# Patient Record
Sex: Female | Born: 1945 | Race: White | Hispanic: No | Smoking: Former smoker
Health system: Southern US, Community
[De-identification: ages and names within clinical notes are randomized; demographics above are authoritative.]

## PROBLEM LIST (undated history)

## (undated) DIAGNOSIS — D649 Anemia, unspecified: Secondary | ICD-10-CM

## (undated) DIAGNOSIS — R319 Hematuria, unspecified: Secondary | ICD-10-CM

## (undated) DIAGNOSIS — E039 Hypothyroidism, unspecified: Secondary | ICD-10-CM

## (undated) DIAGNOSIS — Z79899 Other long term (current) drug therapy: Secondary | ICD-10-CM

## (undated) DIAGNOSIS — I369 Nonrheumatic tricuspid valve disorder, unspecified: Secondary | ICD-10-CM

## (undated) DIAGNOSIS — G9081 Serotonin syndrome: Secondary | ICD-10-CM

## (undated) DIAGNOSIS — R413 Other amnesia: Secondary | ICD-10-CM

## (undated) DIAGNOSIS — F322 Major depressive disorder, single episode, severe without psychotic features: Secondary | ICD-10-CM

## (undated) DIAGNOSIS — K6289 Other specified diseases of anus and rectum: Secondary | ICD-10-CM

## (undated) DIAGNOSIS — N8111 Cystocele, midline: Secondary | ICD-10-CM

## (undated) DIAGNOSIS — M479 Spondylosis, unspecified: Secondary | ICD-10-CM

## (undated) DIAGNOSIS — N184 Chronic kidney disease, stage 4 (severe): Secondary | ICD-10-CM

## (undated) DIAGNOSIS — A0472 Enterocolitis due to Clostridium difficile, not specified as recurrent: Secondary | ICD-10-CM

## (undated) DIAGNOSIS — N390 Urinary tract infection, site not specified: Secondary | ICD-10-CM

## (undated) DIAGNOSIS — M170 Bilateral primary osteoarthritis of knee: Secondary | ICD-10-CM

## (undated) DIAGNOSIS — R296 Repeated falls: Secondary | ICD-10-CM

## (undated) DIAGNOSIS — R339 Retention of urine, unspecified: Secondary | ICD-10-CM

## (undated) DIAGNOSIS — N200 Calculus of kidney: Secondary | ICD-10-CM

## (undated) DIAGNOSIS — Z9884 Bariatric surgery status: Secondary | ICD-10-CM

## (undated) DIAGNOSIS — N819 Female genital prolapse, unspecified: Secondary | ICD-10-CM

## (undated) DIAGNOSIS — I150 Renovascular hypertension: Secondary | ICD-10-CM

## (undated) DIAGNOSIS — I5030 Unspecified diastolic (congestive) heart failure: Secondary | ICD-10-CM

## (undated) DIAGNOSIS — I10 Essential (primary) hypertension: Secondary | ICD-10-CM

## (undated) DIAGNOSIS — N816 Rectocele: Secondary | ICD-10-CM

## (undated) DIAGNOSIS — C541 Malignant neoplasm of endometrium: Secondary | ICD-10-CM

## (undated) DIAGNOSIS — N3941 Urge incontinence: Secondary | ICD-10-CM

## (undated) DIAGNOSIS — H9190 Unspecified hearing loss, unspecified ear: Secondary | ICD-10-CM

## (undated) DIAGNOSIS — I639 Cerebral infarction, unspecified: Secondary | ICD-10-CM

## (undated) DIAGNOSIS — I700 Atherosclerosis of aorta: Secondary | ICD-10-CM

## (undated) DIAGNOSIS — R0600 Dyspnea, unspecified: Secondary | ICD-10-CM

## (undated) DIAGNOSIS — I471 Supraventricular tachycardia, unspecified: Secondary | ICD-10-CM

## (undated) DIAGNOSIS — G4700 Insomnia, unspecified: Secondary | ICD-10-CM

## (undated) DIAGNOSIS — I951 Orthostatic hypotension: Secondary | ICD-10-CM

## (undated) DIAGNOSIS — F1120 Opioid dependence, uncomplicated: Secondary | ICD-10-CM

## (undated) DIAGNOSIS — R569 Unspecified convulsions: Secondary | ICD-10-CM

## (undated) DIAGNOSIS — G4730 Sleep apnea, unspecified: Secondary | ICD-10-CM

## (undated) DIAGNOSIS — G9340 Encephalopathy, unspecified: Secondary | ICD-10-CM

## (undated) DIAGNOSIS — B9620 Unspecified Escherichia coli [E. coli] as the cause of diseases classified elsewhere: Secondary | ICD-10-CM

## (undated) DIAGNOSIS — G2579 Other drug induced movement disorders: Secondary | ICD-10-CM

## (undated) DIAGNOSIS — R000 Tachycardia, unspecified: Secondary | ICD-10-CM

## (undated) DIAGNOSIS — M47815 Spondylosis without myelopathy or radiculopathy, thoracolumbar region: Secondary | ICD-10-CM

## (undated) HISTORY — DX: Other specified diseases of anus and rectum: K62.89

## (undated) HISTORY — DX: Nonrheumatic tricuspid valve disorder, unspecified: I36.9

## (undated) HISTORY — DX: Insomnia, unspecified: G47.00

## (undated) HISTORY — PX: ANTERIOR AND POSTERIOR VAGINAL REPAIR: SUR5

## (undated) HISTORY — DX: Major depressive disorder, single episode, severe without psychotic features: F32.2

## (undated) HISTORY — DX: Hypocalcemia: E83.51

## (undated) HISTORY — DX: Unspecified hearing loss, unspecified ear: H91.90

## (undated) HISTORY — DX: Calculus of kidney: N20.0

## (undated) HISTORY — PX: HIP ARTHROPLASTY: SHX981

## (undated) HISTORY — PX: EXPLORATORY LAPAROTOMY: SUR591

## (undated) HISTORY — DX: Cystocele, midline: N81.11

## (undated) HISTORY — DX: Chronic kidney disease, stage 4 (severe): N18.4

## (undated) HISTORY — DX: Hypothyroidism, unspecified: E03.9

## (undated) HISTORY — DX: Supraventricular tachycardia, unspecified: I47.10

## (undated) HISTORY — DX: Unspecified convulsions: R56.9

## (undated) HISTORY — DX: Malignant neoplasm of endometrium: C54.1

## (undated) HISTORY — DX: Retention of urine, unspecified: R33.9

## (undated) HISTORY — DX: Rectocele: N81.6

## (undated) HISTORY — DX: Renovascular hypertension: I15.0

## (undated) HISTORY — DX: Spondylosis, unspecified: M47.9

## (undated) HISTORY — DX: Unspecified diastolic (congestive) heart failure: I50.30

## (undated) HISTORY — DX: Urge incontinence: N39.41

## (undated) HISTORY — PX: INCONTINENCE SURGERY: SHX676

## (undated) HISTORY — DX: Other long term (current) drug therapy: Z79.899

## (undated) HISTORY — DX: Serotonin syndrome: G90.81

## (undated) HISTORY — PX: ANAL SPHINCTEROPLASTY: SUR1305

## (undated) HISTORY — DX: Essential (primary) hypertension: I10

## (undated) HISTORY — DX: Bilateral primary osteoarthritis of knee: M17.0

## (undated) HISTORY — DX: Enterocolitis due to Clostridium difficile, not specified as recurrent: A04.72

## (undated) HISTORY — DX: Opioid dependence, uncomplicated: F11.20

## (undated) HISTORY — DX: Other drug induced movement disorders: G25.79

## (undated) HISTORY — DX: Encephalopathy, unspecified: G93.40

## (undated) HISTORY — PX: ORIF ELBOW FRACTURE: SUR928

## (undated) HISTORY — PX: CYSTOSCOPY: SUR368

## (undated) HISTORY — DX: Urinary tract infection, site not specified: N39.0

## (undated) HISTORY — DX: Female genital prolapse, unspecified: N81.9

## (undated) HISTORY — DX: Spondylosis without myelopathy or radiculopathy, thoracolumbar region: M47.815

## (undated) HISTORY — DX: Other amnesia: R41.3

## (undated) HISTORY — PX: RECTOCELE REPAIR: SHX761

## (undated) HISTORY — DX: Bariatric surgery status: Z98.84

## (undated) HISTORY — PX: LAPAROSCOPIC HYSTERECTOMY: SHX1926

## (undated) HISTORY — DX: Atherosclerosis of aorta: I70.0

## (undated) HISTORY — PX: PANNICULECTOMY: SUR1001

## (undated) HISTORY — DX: Hematuria, unspecified: R31.9

## (undated) HISTORY — DX: Repeated falls: R29.6

## (undated) HISTORY — DX: Tachycardia, unspecified: R00.0

## (undated) HISTORY — DX: Anemia, unspecified: D64.9

## (undated) HISTORY — DX: Orthostatic hypotension: I95.1

## (undated) HISTORY — PX: OTHER SURGICAL HISTORY: SHX169

## (undated) HISTORY — DX: Supraventricular tachycardia: I47.1

## (undated) HISTORY — PX: CHOLECYSTECTOMY: SHX55

## (undated) HISTORY — PX: ANTERIOR AND POSTERIOR VAGINAL REPAIR W/ SACROSPINOUS LIGAMENT SUSPENSION: SUR6

## (undated) HISTORY — DX: Urinary tract infection, site not specified: B96.20

---

## 1995-02-07 HISTORY — PX: GASTRIC BYPASS: SHX52

## 2011-02-07 HISTORY — PX: DIAGNOSTIC MAMMOGRAM: HXRAD719

## 2011-02-07 HISTORY — PX: DG  BONE DENSITY (ARMC HX): HXRAD1102

## 2015-07-08 LAB — HM COLONOSCOPY

## 2015-08-07 HISTORY — PX: COLONOSCOPY: SHX174

## 2015-09-06 LAB — BASIC METABOLIC PANEL
BUN: 38 — AB (ref 4–21)
CREATININE: 2.8 — AB (ref 0.5–1.1)
GLUCOSE: 89
POTASSIUM: 6 — AB (ref 3.4–5.3)
SODIUM: 141 (ref 137–147)

## 2015-09-06 LAB — LIPID PANEL
Cholesterol: 192 (ref 0–200)
HDL: 43 (ref 35–70)
LDL Cholesterol: 115
Triglycerides: 175 — AB (ref 40–160)

## 2015-09-06 LAB — CBC AND DIFFERENTIAL
HCT: 39 (ref 36–46)
HEMOGLOBIN: 12.1 (ref 12.0–16.0)
Platelets: 336 (ref 150–399)
WBC: 9.1

## 2015-09-06 LAB — HEPATIC FUNCTION PANEL
ALT: 9 (ref 7–35)
AST: 13 (ref 13–35)
Alkaline Phosphatase: 154 — AB (ref 25–125)
Bilirubin, Total: 0.3

## 2015-09-06 LAB — TSH: TSH: 0.1 — AB (ref 0.41–5.90)

## 2016-08-10 LAB — HEPATIC FUNCTION PANEL
ALK PHOS: 141 — AB (ref 25–125)
ALT: 24 (ref 7–35)
AST: 32 (ref 13–35)
Bilirubin, Total: 0.4

## 2016-08-10 LAB — BASIC METABOLIC PANEL
BUN: 53 — AB (ref 4–21)
CREATININE: 6.3 — AB (ref 0.5–1.1)
Glucose: 75
Potassium: 3.3 — AB (ref 3.4–5.3)
Sodium: 136 — AB (ref 137–147)

## 2016-08-10 LAB — CBC AND DIFFERENTIAL
HEMATOCRIT: 31 — AB (ref 36–46)
HEMOGLOBIN: 10.5 — AB (ref 12.0–16.0)
Platelets: 446 — AB (ref 150–399)
WBC: 10.6

## 2016-09-21 LAB — CBC AND DIFFERENTIAL
HCT: 28 — AB (ref 36–46)
Hemoglobin: 9.3 — AB (ref 12.0–16.0)
PLATELETS: 455 — AB (ref 150–399)
WBC: 11.3

## 2016-10-14 LAB — CBC AND DIFFERENTIAL
HCT: 23 — AB (ref 36–46)
Hemoglobin: 7.4 — AB (ref 12.0–16.0)
Platelets: 352 (ref 150–399)
WBC: 9.3

## 2016-10-14 LAB — BASIC METABOLIC PANEL
BUN: 52 — AB (ref 4–21)
CREATININE: 5.7 — AB (ref 0.5–1.1)
GLUCOSE: 62
POTASSIUM: 3 — AB (ref 3.4–5.3)
SODIUM: 140 (ref 137–147)

## 2016-10-14 LAB — HEPATIC FUNCTION PANEL
ALT: 95 — AB (ref 7–35)
AST: 214 — AB (ref 13–35)
Alkaline Phosphatase: 244 — AB (ref 25–125)
Bilirubin, Total: 0.2

## 2016-12-07 HISTORY — PX: US KIDNEYS (ARMC HX): HXRAD1125

## 2016-12-12 ENCOUNTER — Other Ambulatory Visit: Payer: Self-pay | Admitting: Nephrology

## 2016-12-12 DIAGNOSIS — N184 Chronic kidney disease, stage 4 (severe): Secondary | ICD-10-CM

## 2016-12-15 ENCOUNTER — Other Ambulatory Visit: Payer: Self-pay

## 2016-12-19 ENCOUNTER — Ambulatory Visit
Admission: RE | Admit: 2016-12-19 | Discharge: 2016-12-19 | Disposition: A | Discharge status: Home / Self Care | Payer: Medicare Other | Source: Ambulatory Visit | Attending: Nephrology | Admitting: Nephrology

## 2016-12-19 DIAGNOSIS — N184 Chronic kidney disease, stage 4 (severe): Secondary | ICD-10-CM

## 2017-01-03 ENCOUNTER — Ambulatory Visit: Payer: Medicare Other | Admitting: Family Medicine

## 2017-01-05 ENCOUNTER — Ambulatory Visit: Payer: Medicare Other | Admitting: Family Medicine

## 2017-01-05 ENCOUNTER — Encounter: Payer: Self-pay | Admitting: Family Medicine

## 2017-01-05 NOTE — Progress Notes (Signed)
Pt presented with her daughter to establish care.  Neither were made aware that this provider is not prescribing controlled substances/narcotics.  Per pt's daughter they have another appointment scheduled for next wk and will try that provider.

## 2017-01-12 ENCOUNTER — Ambulatory Visit (INDEPENDENT_AMBULATORY_CARE_PROVIDER_SITE_OTHER): Payer: Medicare Other | Admitting: Internal Medicine

## 2017-01-12 ENCOUNTER — Encounter: Payer: Self-pay | Admitting: Internal Medicine

## 2017-01-12 ENCOUNTER — Telehealth: Payer: Self-pay | Admitting: *Deleted

## 2017-01-12 VITALS — BP 106/58 | HR 80 | Temp 97.3°F | Ht 62.0 in | Wt 115.6 lb

## 2017-01-12 DIAGNOSIS — R5381 Other malaise: Secondary | ICD-10-CM | POA: Diagnosis not present

## 2017-01-12 DIAGNOSIS — E8809 Other disorders of plasma-protein metabolism, not elsewhere classified: Secondary | ICD-10-CM | POA: Diagnosis not present

## 2017-01-12 DIAGNOSIS — E034 Atrophy of thyroid (acquired): Secondary | ICD-10-CM

## 2017-01-12 DIAGNOSIS — D808 Other immunodeficiencies with predominantly antibody defects: Secondary | ICD-10-CM

## 2017-01-12 DIAGNOSIS — R778 Other specified abnormalities of plasma proteins: Secondary | ICD-10-CM | POA: Diagnosis not present

## 2017-01-12 DIAGNOSIS — Z9189 Other specified personal risk factors, not elsewhere classified: Secondary | ICD-10-CM

## 2017-01-12 DIAGNOSIS — R634 Abnormal weight loss: Secondary | ICD-10-CM | POA: Diagnosis not present

## 2017-01-12 DIAGNOSIS — D649 Anemia, unspecified: Secondary | ICD-10-CM | POA: Diagnosis not present

## 2017-01-12 DIAGNOSIS — L299 Pruritus, unspecified: Secondary | ICD-10-CM | POA: Diagnosis not present

## 2017-01-12 DIAGNOSIS — S31000A Unspecified open wound of lower back and pelvis without penetration into retroperitoneum, initial encounter: Secondary | ICD-10-CM

## 2017-01-12 DIAGNOSIS — F322 Major depressive disorder, single episode, severe without psychotic features: Secondary | ICD-10-CM | POA: Diagnosis not present

## 2017-01-12 DIAGNOSIS — N185 Chronic kidney disease, stage 5: Secondary | ICD-10-CM | POA: Diagnosis not present

## 2017-01-12 DIAGNOSIS — R945 Abnormal results of liver function studies: Secondary | ICD-10-CM

## 2017-01-12 DIAGNOSIS — D8989 Other specified disorders involving the immune mechanism, not elsewhere classified: Secondary | ICD-10-CM

## 2017-01-12 DIAGNOSIS — I480 Paroxysmal atrial fibrillation: Secondary | ICD-10-CM | POA: Diagnosis not present

## 2017-01-12 DIAGNOSIS — R7989 Other specified abnormal findings of blood chemistry: Secondary | ICD-10-CM

## 2017-01-12 DIAGNOSIS — Z1159 Encounter for screening for other viral diseases: Secondary | ICD-10-CM

## 2017-01-12 MED ORDER — MIRTAZAPINE 15 MG PO TBDP: 15.0000 mg | ORAL_TABLET | Freq: Every day | ORAL | 6 refills | Status: DC

## 2017-01-12 NOTE — Telephone Encounter (Signed)
Patient's daughter walked in with patient's medications, I have corrected the med list.

## 2017-01-12 NOTE — Progress Notes (Deleted)
Patient ID: Robin Hampton, female   DOB: 09-29-1945, 71 y.o.   MRN: [id number]    Location:  [facility] Place of Service: OFFICE    Advanced Directive information    Chief Complaint  Patient presents with  . Establish Care    New Patient to establish, Patient here with daughter  . Other    patient caregiver did not bring current medications, she has been notified that Dr. Lu Duffel not give any refills today  . Sleeping Problem    bad dreams, and unable to fall asleep and stay sleep  . Skin Problem    itching all over  . Medication Problem    Coreg pm dose stopped on Wednesday 01/10/2017, Furosemide pm dose stopped 12/30/2016, Hydralazine stopped 01/01/2017     HPI:  ***  Past Medical History:  Diagnosis Date  . Anemia   . Aortic atherosclerosis ([place])   . Chronic UTI (urinary tract infection)   . Clostridium difficile diarrhea   . Diastolic CHF ([place])   . E-coli UTI   . Encephalopathy   . Endometrial ca ([place])   . History of gastric bypass   . Hx of gastric bypass   . Hypertension   . Hypothyroidism   . Kidney stones   . Nonrheumatic tricuspid valve disorder   . Orthostatic hypotension   . Osteoarthritis of back   . Osteoarthritis of knees, bilateral   . Paroxysmal supraventricular tachycardia ([place])   . Renal failure, chronic, stage 4 (severe) ([facility])   . Repeated falls   . Seizure ([place])   . Serotonin syndrome   . Severe depression ([place])   . Tachycardia   . Urinary retention    requiring self-catheterization  . Vaginal vault prolapse     Past Surgical History:  Procedure Laterality Date  . COLONOSCOPY  08/2015  . DG  BONE DENSITY ([place] [facility])  2013  . DIAGNOSTIC MAMMOGRAM  2013  . GASTRIC BYPASS  1997   Dr. Greig Castilla  . INCONTINENCE SURGERY     Jefferey McQueary  . RECTOCELE REPAIR     Jefferey McQueary  . US KIDNEYS ([place] [facility])  12/2016   Dr. Eliberto Ivory Kidney ordered     Patient Care Team: Patient, No Pcp Per as PCP - General (General Practice)  Social History     Socioeconomic History  . Marital status: Divorced    Spouse name: Not on file  . Number of children: Not on file  . Years of education: Not on file  . Highest education level: Not on file  Social Needs  . Financial resource strain: Not on file  . Food insecurity - worry: Not on file  . Food insecurity - inability: Not on file  . Transportation needs - medical: Not on file  . Transportation needs - non-medical: Not on file  Occupational History  . Not on file  Tobacco Use  . Smoking status: Former Smoker    Years: 25.00    Types: Cigarettes  . Smokeless tobacco: Never Used  . Tobacco comment: social smoker in college up to late 30's  Substance and Sexual Activity  . Alcohol use: Yes    Alcohol/week: 0.6 - 1.2 oz    Types: 1 - 2 Standard drinks or equivalent per week    Frequency: Never    Comment: 2 days a week  . Drug use: No  . Sexual activity: Not Currently  Other Topics Concern  . Not on file  Social History Narrative   Social History  Diet? Renal diet; 6 lb weight loss since DC from [facility]      Do you drink/eat things with caffeine? Occasional coke 1-2 times per month      Marital status?        divorced                            What year were you married? 1966      Do you live in a house, apartment, assisted living, condo, trailer, etc.? Daughter's 3 story home on 2nd floor      Is it one or more stories? 3      How many persons live in your home? 5      Do you have any pets in your home? (please list) 6 cats, 1 dog      Highest level of education completed? 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       No, dizzy, too weak                               Type & how often? PT 2 times per week; at bedside- new onset orthostatic hypotension      Advanced Directives      Do you have a living will? yes      Do you have a DNR form?                                  If  not, do you want to discuss one? yes      Do you have signed POA/HPOA for forms? yes      Functional Status      Do you have difficulty bathing or dressing yourself? Yes- exhausts her      Do you have difficulty preparing food or eating? Yes- done for her      Do you have difficulty managing your medications? Yes- 2 years poor management; past CVA sometime 2017      Do you have difficulty managing your finances? Yes- daughter manages now- almost lost everything      Do you have difficulty affording your medications? Yes- recent boyfriend 65 years  stole retirement. APS involved in [place]     reports that she has quit smoking. Her smoking use included cigarettes. She quit after 25.00 years of use. she has never used smokeless tobacco. She reports that she drinks about 0.6 - 1.2 oz of alcohol per week. She reports that she does not use drugs.  Family History  Problem Relation Age of Onset  . Hypertension Mother   . Heart attack Mother   . Colon cancer Mother   . Hypothyroidism Mother   . Renal cancer Father   . CVA Father   . Hypertension Father   . CVA Brother   . Bipolar disorder Brother   . Depression Brother   . Hypertension Brother   . Osteoarthritis Brother   . Asthma Daughter   . Allergies Daughter    Family Status  Relation Name Status  . Mother Benjamine Mola Deceased  . Father Arnell Sieving Deceased  . Sister [employer]  . Brother [employer]  . Daughter [employer] History  Administered Date(s) Administered  . Influenza-Unspecified 11/06/2016  . Pneumococcal Polysaccharide-23 11/06/2016  . Tetanus 11/06/2016    Allergies  Allergen Reactions  . Phenergan [Promethazine]     Pulmonary arrest   . Codeine Nausea Only  . Sulfa Antibiotics     Patient states she is not allergic to Sulfa and has had the medication a few times without a reaction  . Trazodone And Nefazodone     Serotonin syndrome    Medications:   Medication List         Accurate as of 01/12/17 11:40 AM. Always use your most recent med list.          acetaminophen 500 MG tablet Commonly known as:  TYLENOL   amLODipine 10 MG tablet Commonly known as:  NORVASC   B-12 1000 MCG/ML Kit   BANOPHEN 25 mg capsule Generic drug:  diphenhydrAMINE   calcitRIOL 0.25 MCG capsule Commonly known as:  ROCALTROL   calcium acetate 667 MG capsule Commonly known as:  PHOSLO   calcium carbonate 500 MG chewable tablet Commonly known as:  TUMS - dosed in mg elemental calcium   carvedilol 6.25 MG tablet Commonly known as:  COREG   cetirizine 10 MG tablet Commonly known as:  ZYRTEC   Cholecalciferol 1000 units capsule   famotidine 20 MG tablet Commonly known as:  PEPCID   ferrous gluconate 324 MG tablet Commonly known as:  FERGON   furosemide 20 MG tablet Commonly known as:  LASIX   hydrALAZINE 50 MG tablet Commonly known as:  APRESOLINE   levothyroxine 200 MCG tablet Commonly known as:  SYNTHROID, LEVOTHROID   omeprazole 20 MG capsule Commonly known as:  PRILOSEC   oxyCODONE 5 MG immediate release tablet Commonly known as:  Oxy IR/ROXICODONE   sodium bicarbonate 650 MG tablet   thiamine 100 MG tablet       Review of Systems  Vitals:   01/12/17 1135  BP: (!) 106/58  Pulse: 80  Temp: (!) 97.3 F (36.3 C)  TempSrc: Oral  SpO2: 96%  Weight: 115 lb 9.6 oz (52.4 kg)  Height: 5' 2"  (1.575 m)   Body mass index is 21.14 kg/m.  Physical Exam   Labs reviewed: Abstract on 01/11/2017  Component Date Value Ref Range Status  . Hemoglobin 10/14/2016 7.4* 12.0 - 16.0 Final  . HCT 10/14/2016 23* 36 - 46 Final  . Platelets 10/14/2016 352  150 - 399 Final  . WBC 10/14/2016 9.3   Final  . Glucose 10/14/2016 62   Final  . BUN 10/14/2016 52* 4 - 21 Final  . Creatinine 10/14/2016 5.7* 0.5 - 1.1 Final  . Potassium 10/14/2016 3.0* 3.4 - 5.3 Final  . Sodium 10/14/2016 140  137 - 147 Final  . Alkaline Phosphatase 10/14/2016 244* 25 - 125 Final    . ALT 10/14/2016 95* 7 - 35 Final  . AST 10/14/2016 214* 13 - 35 Final  . Bilirubin, Total 10/14/2016 0.2   Final    US Renal  Result Date: 12/20/2016 CLINICAL DATA:  Chronic kidney disease stage 4. EXAM: RENAL / URINARY TRACT ULTRASOUND COMPLETE COMPARISON:  None. FINDINGS: Right Kidney: Length: 9.4 cm. Increased echogenicity of renal parenchyma is noted. No hydronephrosis visualized. 2 cm cystic abnormality is seen in midpole of right kidney. 3.3 cm septated cystic abnormality is seen in lower pole consistent with Bosniak type 2 lesion. Left Kidney: Length: 11.1 cm. Increased echogenicity of renal parenchyma is noted. No hydronephrosis visualized. 6 mm calculus is noted  in lower pole. 2.5 cm cyst is noted in lower pole. 2.5 cm simple cyst is seen in midpole. Bladder: Mild lobular thickening of posterior wall of urinary bladder is noted which may represent inflammation, but cystoscopy is recommended to rule out neoplasm. IMPRESSION: Mild lobular thickening of posterior wall of urinary bladder which may represent inflammation, but cystoscopy is recommended to rule out neoplasm. Bilateral renal cysts are noted. 3.3 cm septated cyst is noted in lower pole of right kidney consistent with Bosniak type 2 lesion. Increased echogenicity of renal parenchyma is noted bilaterally consistent with medical renal disease. Electronically Signed   By: Marijo Conception, M.D.   On: 12/20/2016 08:34     Assessment/Plan    Robin Hampton  [facility] [address] [place], [place] [address] [phone] Cell (Monday-Friday 8 AM - 5 PM) [phone] After 5 PM and follow prompts

## 2017-01-12 NOTE — Progress Notes (Signed)
Patient ID: Robin Hampton, female   DOB: 1945-06-13, 71 y.o.   MRN: [id number]   Location:  [facility] OFFICE  Provider: DR Arletha Grippe  Code Status: FULL CODE Goals of Care: No flowsheet data found.   Chief Complaint  Patient presents with  . Establish Care    New Patient to establish, Patient here with daughter  . Other    patient caregiver did not bring current medications, she has been notified that Dr. Lu Duffel not give any refills today  . Sleeping Problem    bad dreams, and unable to fall asleep and stay sleep  . Skin Problem    itching all over  . Medication Problem    Coreg pm dose stopped on Wednesday 01/10/2017, Furosemide pm dose stopped 12/30/2016, Hydralazine stopped 01/01/2017     HPI:  She is a 71 y.o. female seen today as a new pt. She has multiple concerns which appear to be interconnected. See ROS for complete list. Daughter relocated pt to [place] from [place] about 3 mos ago. She rec'd short term rehab at [facility] after prolonged stay in [facility] hospital in [place] and now lives with daughter. Her bedroom is upstairs and pt must navigate 17 stairs when she leaves the home for appts. Pt does spend most of the time upstairs where she has a sitting area, small kitchenette area. Prior to relocation, pt was living in [place] with her female significant other x 36 yrs. Living conditions were poor.   In Sept 2018, pt was taken to the hospital for FTT and weakness. TSH 390.5, marked elevated LFTS/alk phos, markedly anemic with Hgb low 7s. She had to be transfused PRBCs. 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 In the past, she saw oncology but daughter unsure why. She does have a hx endometrial ca s/p TAH.   She saw nephrology Dr Posey Pronto and will be attending a kidney class next week. She was told she may need dialysis. She does have frothy appearing urine and also making less urine. To obtain urine sample at nephro office last month, she had to be straight cath. Urine cx (+) >100K ESBL Hafnia bacteria. Unsure whether  she was treated. meds changes made this week due to orthostatic hypotension. She has recurrent UTIs.  Cr 4.78 last month; Hgb 9.9; Phos 4.6. Nephro ordered SPEP (no M spike) with elevated kappa FLC (162.3) and lambda FLC (146).   Pt with increased depression and spends most of the day in bed with anhedonia, decreased appetite, weight loss (down 10 lbs in last month alone). Albumin 3.1 (prev 1.7). She has developed sacral sore and would like HH RN, homecare aid, SW in addition to [facility] PT/OT already getting. Memory is worsening  Pt is a poor historian due to memory loss. Hx obtained from chart. Did discuss advanced directives. Pt has a HCPOA (daughter). She prefers FULL CODE. She has been noncompliant with medications and medical recommendations in the past per hospital record.   Past Medical History:  Diagnosis Date  . Anemia   . Aortic atherosclerosis ([place])   . Chronic UTI (urinary tract infection)   . Clostridium difficile diarrhea   . Diastolic CHF ([place])   . E-coli UTI   . Encephalopathy   . Endometrial ca ([place])   . History of gastric bypass   . Hx of gastric bypass   . Hypertension   . Hypothyroidism   . Kidney stones   . Nonrheumatic tricuspid valve disorder   . Orthostatic hypotension   .  Osteoarthritis of back   . Osteoarthritis of knees, bilateral   . Paroxysmal supraventricular tachycardia ([place])   . Renal failure, chronic, stage 4 (severe) (HCC)   . Repeated falls   . Seizure ([place])   . Serotonin syndrome   . Severe depression ([place])   . Tachycardia   . Urinary retention    requiring self-catheterization  . Vaginal vault prolapse     Past Surgical History:  Procedure Laterality Date  . COLONOSCOPY  08/2015  . DG  BONE DENSITY ([place] HX)  2013  . DIAGNOSTIC MAMMOGRAM  2013  . GASTRIC BYPASS  1997   Dr. Greig Castilla  . INCONTINENCE SURGERY     Jefferey McQueary  . RECTOCELE REPAIR     Jefferey McQueary  . US KIDNEYS ([place] HX)  12/2016   Dr. Eliberto Ivory Kidney ordered     Family History  Problem Relation Age of Onset  . Hypertension Mother   . Heart attack Mother   . Colon cancer Mother   . Hypothyroidism Mother   . Renal cancer Father   . CVA Father   . Hypertension Father   . CVA Brother   . Bipolar disorder Brother   . Depression Brother   . Hypertension Brother   . Osteoarthritis Brother   . Asthma Daughter   . Allergies Daughter    Social History   Socioeconomic History  . Marital status: Divorced    Spouse name: None  . Number of children: None  . Years of education: None  . Highest education level: None  Social Needs  . Financial resource strain: None  . Food insecurity - worry: None  . Food insecurity - inability: None  . Transportation needs - medical: None  . Transportation needs - non-medical: None  Occupational History  . None  Tobacco Use  . Smoking status: Former Smoker    Years: 25.00    Types: Cigarettes  . Smokeless tobacco: Never Used  . Tobacco comment: social smoker in college up to late 30's  Substance and Sexual Activity  . Alcohol use: Yes    Alcohol/week: 0.6 - 1.2 oz    Types: 1 - 2 Standard drinks or equivalent per week    Frequency: Never    Comment: 2 days a week  . Drug use: No  . Sexual activity: Not Currently  Other Topics Concern  . None  Social History Narrative   Social History      Diet? Renal diet; 6 lb weight loss since DC from [facility]      Do you drink/eat things with caffeine? Occasional coke 1-2 times per month      Marital status?        divorced                            What year were you married? 1966      Do you live in a house, apartment, assisted living, condo, trailer, etc.? Daughter's 3 story home on 2nd floor      Is it one or more stories? 3      How many persons live in your home? 5      Do you have any pets in your home? (please list) 6 cats, 1 dog      Highest level of education completed? 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       No, dizzy, too weak                               Type & how often? PT 2 times per week; at bedside- new onset orthostatic hypotension      Advanced Directives      Do you have a living will? yes      Do you have a DNR form?                                  If not, do you want to discuss one? yes      Do you have signed POA/HPOA for forms? yes      Functional Status      Do you have difficulty bathing or dressing yourself? Yes- exhausts her      Do you have difficulty preparing food or eating? Yes- done for her      Do you have difficulty managing your medications? Yes- 2 years poor management; past CVA sometime 2017      Do you have difficulty managing your finances? Yes- daughter manages now- almost lost everything      Do you have difficulty affording your medications? Yes- recent boyfriend 70 years  stole retirement. APS involved in [place]    Allergies  Allergen Reactions  . Phenergan [Promethazine]     Pulmonary arrest   . Codeine Nausea Only  . Sulfa Antibiotics     Patient states she is not allergic to Sulfa and has had the medication a few times without a reaction  . Trazodone And Nefazodone     Serotonin syndrome    Outpatient Encounter Medications as of 01/12/2017  Medication Sig  . acetaminophen (TYLENOL) 500 MG tablet Take 500 mg by mouth daily at 2 am.   . amLODipine (NORVASC) 10 MG tablet Take 10 mg by mouth daily.   . calcitRIOL (ROCALTROL) 0.25 MCG capsule Take 0.25 mcg by mouth daily.  . calcium acetate (PHOSLO) 667 MG capsule Take 1,334 mg by mouth 3 (three) times daily with meals.   . calcium carbonate (TUMS - DOSED IN MG ELEMENTAL CALCIUM) 500 MG chewable tablet Chew 1 tablet by mouth daily.  . carvedilol (COREG) 6.25 MG tablet Take 6.25 mg by mouth 2 (two) times daily with a meal.  . cetirizine (ZYRTEC) 10 MG tablet Take 10 mg by mouth daily.  . Cholecalciferol 1000 units capsule Take  1,000 Units by mouth daily.  . Cyanocobalamin (B-12) 1000 MCG/ML KIT Inject 1,000 mcg as directed every 30 (thirty) days.  . diphenhydrAMINE (BANOPHEN) 25 mg capsule Take 25 mg by mouth every 6 (six) hours as needed.  . famotidine (PEPCID) 20 MG tablet Take 20 mg by mouth daily.   . ferrous gluconate (FERGON) 324 MG tablet Take 324 mg by mouth daily with breakfast.  . furosemide (LASIX) 20 MG tablet Take 10 mg by mouth daily. 1/2 tab  . hydrALAZINE (APRESOLINE) 50 MG tablet Take 50 mg by mouth 3 (three) times daily.  Marland Kitchen levothyroxine (SYNTHROID, LEVOTHROID) 200 MCG tablet Take 200 mcg by mouth daily before breakfast.   . omeprazole (PRILOSEC) 20 MG capsule Take 20 mg by mouth daily.   Marland Kitchen oxyCODONE (OXY IR/ROXICODONE) 5 MG immediate release tablet Take 5 mg by mouth 2 (two) times daily.   . sodium bicarbonate  650 MG tablet Take 650 mg by mouth 3 (three) times daily.  Marland Kitchen thiamine 100 MG tablet Take 100 mg by mouth daily.  . mirtazapine (REMERON SOL-TAB) 15 MG disintegrating tablet Take 1 tablet (15 mg total) by mouth at bedtime.   No facility-administered encounter medications on file as of 01/12/2017.     Review of Systems:  Review of Systems  Constitutional: Positive for appetite change (decreased), chills, fatigue and unexpected weight change (loss 10 lbs in last month).  HENT: Positive for hearing loss and sinus pressure.        Dry mouth  Respiratory: Positive for shortness of breath.        (+) orthopnea (3-4 pillows)  Cardiovascular: Positive for palpitations.  Endocrine: Positive for cold intolerance.  Genitourinary: Positive for frequency and urgency.       Nocturia (q1.5 hrs); hx endometrial cancer  Musculoskeletal: Positive for arthralgias, back pain and gait problem.       Deformities, OA; recent fall with left hip (12/30/16)  Skin: Positive for pallor and wound (sacral; left heel).       pruritis  Neurological: Positive for dizziness (with change in position), seizures,  weakness and headaches.       Loss of balance; memory loss  Psychiatric/Behavioral: Positive for dysphoric mood and sleep disturbance. The patient is nervous/anxious.   All other systems reviewed and are negative.   Health Maintenance  Topic Date Due  . Hepatitis C Screening  1945-07-21  . MAMMOGRAM  12/11/1994  . COLONOSCOPY  12/11/1994  . DEXA SCAN  12/10/2009  . PNA vac Low Risk Adult (2 of 2 - PCV13) 11/06/2017  . TETANUS/TDAP  11/07/2026  . INFLUENZA VACCINE  Completed    Physical Exam: Vitals:   01/12/17 1135  BP: (!) 106/58  Pulse: 80  Temp: (!) 97.3 F (36.3 C)  TempSrc: Oral  SpO2: 96%  Weight: 115 lb 9.6 oz (52.4 kg)  Height: 5' 2"  (1.575 m)   Body mass index is 21.14 kg/m. Physical Exam  Constitutional: She is oriented to person, place, and time. She appears well-developed.  Looks pale, frail appearing in NAD sitting in w/c.   HENT:  Mouth/Throat: Oropharynx is clear and moist. No oropharyngeal exudate.  MMM; no oral thrush  Eyes: Pupils are equal, round, and reactive to light. No scleral icterus.  Conjunctivae pale  Neck: Neck supple. Carotid bruit is not present. No tracheal deviation present. No thyromegaly present.  Cardiovascular: Normal rate and intact distal pulses. An irregularly irregular rhythm present. Frequent extrasystoles are present. Exam reveals no gallop and no friction rub.  Murmur heard.  Systolic murmur is present with a grade of 1/6. No LE edema b/l. no calf TTP.   Pulmonary/Chest: Effort normal and breath sounds normal. No stridor. No respiratory distress. She has no wheezes. She has no rales.  Abdominal: Soft. Normal appearance and bowel sounds are normal. She exhibits no distension and no mass. There is no hepatomegaly. There is no tenderness. There is no rigidity, no rebound and no guarding. No hernia.  Lymphadenopathy:    She has no cervical adenopathy.  Neurological: She is alert and oriented to person, place, and time. She has  normal reflexes.  Skin: Skin is warm and dry. No rash noted. Rash is not vesicular. There is pallor.     Generalized excoriations  Psychiatric: She has a normal mood and affect. Her behavior is normal. Thought content normal. She is not actively hallucinating. Thought content is not paranoid and  not delusional.    Labs reviewed: Basic Metabolic Panel: Recent Labs    10/14/16  NA 140  K 3.0*  BUN 52*  CREATININE 5.7*   Liver Function Tests: Recent Labs    10/14/16  AST 214*  ALT 95*  ALKPHOS 244*   No results for input(s): LIPASE, AMYLASE in the last 8760 hours. No results for input(s): AMMONIA in the last 8760 hours. CBC: Recent Labs    10/14/16  WBC 9.3  HGB 7.4*  HCT 23*  PLT 352   Lipid Panel: No results for input(s): CHOL, HDL, LDLCALC, TRIG, CHOLHDL, LDLDIRECT in the last 8760 hours. No results found for: HGBA1C  Procedures since last visit: US Renal  Result Date: 12/20/2016 CLINICAL DATA:  Chronic kidney disease stage 4. EXAM: RENAL / URINARY TRACT ULTRASOUND COMPLETE COMPARISON:  None. FINDINGS: Right Kidney: Length: 9.4 cm. Increased echogenicity of renal parenchyma is noted. No hydronephrosis visualized. 2 cm cystic abnormality is seen in midpole of right kidney. 3.3 cm septated cystic abnormality is seen in lower pole consistent with Bosniak type 2 lesion. Left Kidney: Length: 11.1 cm. Increased echogenicity of renal parenchyma is noted. No hydronephrosis visualized. 6 mm calculus is noted in lower pole. 2.5 cm cyst is noted in lower pole. 2.5 cm simple cyst is seen in midpole. Bladder: Mild lobular thickening of posterior wall of urinary bladder is noted which may represent inflammation, but cystoscopy is recommended to rule out neoplasm. IMPRESSION: Mild lobular thickening of posterior wall of urinary bladder which may represent inflammation, but cystoscopy is recommended to rule out neoplasm. Bilateral renal cysts are noted. 3.3 cm septated cyst is noted in  lower pole of right kidney consistent with Bosniak type 2 lesion. Increased echogenicity of renal parenchyma is noted bilaterally consistent with medical renal disease. Electronically Signed   By: Marijo Conception, M.D.   On: 12/20/2016 08:34    Assessment/Plan   ICD-10-CM   1. Abnormal LFTs - DDX includes tumor/mets, renal diease, viral (Hep C) R94.5 CMP with eGFR    Hep C Antibody    Gamma GT  2. Itching - probably due to #3 L29.9   3. CKD (chronic kidney disease) stage 5, GFR less than 15 ml/min (HCC) N18.5 CMP with eGFR  4. Anemia, unspecified type - probably related to #3 +/- #5 vs tumor/low albumin D64.9 CBC with Differential/Platelets    Ambulatory referral to Hematology  5. Abnormal SPEP with elevated FLC - etiology unknown R77.8 Ambulatory referral to Hematology  6. Weight loss - multifactorial (MDD, FTT, CKD stage 5) R63.4 mirtazapine (REMERON SOL-TAB) 15 MG disintegrating tablet  7. Hypoalbuminemia with FTT E88.09   8. Physical deconditioning R53.81   9. Wound of sacral region, initial encounter - exacerbated by #7 S31.000A   10. Hypothyroidism due to acquired atrophy of thyroid - uncontrolled E03.4 TSH    T4, Free    Lipid Panel  11. Paroxysmal atrial fibrillation (HCC) - rate controlled I48.0 Lipid Panel  12. Current severe episode of major depressive disorder without psychotic features, unspecified whether recurrent ([place]) - uncontrolled F32.2 mirtazapine (REMERON SOL-TAB) 15 MG disintegrating tablet  13. Encounter for hepatitis C virus screening test for high risk patient Z11.59 Hep C Antibody   Z91.89   14. 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 Needs ECG, MMSE  TIME SPENT > 96mnutes with >50% of time coordinating care/counseling  Katielynn Horan S. CPerlie Gold PMeridian Plastic [facility] Center[facility] [address] [id number]([phone] (Monday-Friday 8 AM - 5 PM) [phone] 5 PM and follow prompts

## 2017-01-12 NOTE — Patient Instructions (Addendum)
No other medications changed today  START REMERON 15MG  AT BEDTIME TO HELP WITH SLEEPING, APPETITE AND DEPRESSION  Will call with lab results  Will call with referral appt to hematology   Follow up with specialists as scheduled  Follow up in 1 month for AWV/EV.

## 2017-01-13 LAB — HEPATITIS C ANTIBODY
HEP C AB: NONREACTIVE
SIGNAL TO CUT-OFF: 0.05 (ref <1.00)

## 2017-01-13 LAB — CBC WITH DIFFERENTIAL/PLATELET
BASOS ABS: 88 {cells}/uL (ref 0–200)
Basophils Relative: 0.7 %
EOS ABS: 542 {cells}/uL — AB (ref 15–500)
EOS PCT: 4.3 %
HEMATOCRIT: 34.2 % — AB (ref 35.0–45.0)
HEMOGLOBIN: 11 g/dL — AB (ref 11.7–15.5)
LYMPHS ABS: 3049 {cells}/uL (ref 850–3900)
MCH: 28.9 pg (ref 27.0–33.0)
MCHC: 32.2 g/dL (ref 32.0–36.0)
MCV: 89.8 fL (ref 80.0–100.0)
MPV: 12.7 fL — ABNORMAL HIGH (ref 7.5–12.5)
Monocytes Relative: 10.2 %
NEUTROS ABS: 7636 {cells}/uL (ref 1500–7800)
NEUTROS PCT: 60.6 %
Platelets: 258 10*3/uL (ref 140–400)
RBC: 3.81 10*6/uL (ref 3.80–5.10)
RDW: 12.6 % (ref 11.0–15.0)
Total Lymphocyte: 24.2 %
WBC: 12.6 10*3/uL — ABNORMAL HIGH (ref 3.8–10.8)
WBCMIX: 1285 {cells}/uL — AB (ref 200–950)

## 2017-01-13 LAB — COMPLETE METABOLIC PANEL WITH GFR
AG RATIO: 0.8 (calc) — AB (ref 1.0–2.5)
ALBUMIN MSPROF: 2.7 g/dL — AB (ref 3.6–5.1)
ALT: 12 U/L (ref 6–29)
AST: 20 U/L (ref 10–35)
Alkaline phosphatase (APISO): 119 U/L (ref 33–130)
BUN / CREAT RATIO: 16 (calc) (ref 6–22)
BUN: 86 mg/dL — ABNORMAL HIGH (ref 7–25)
CALCIUM: 9.9 mg/dL (ref 8.6–10.4)
CO2: 27 mmol/L (ref 20–32)
CREATININE: 5.53 mg/dL — AB (ref 0.60–0.93)
Chloride: 99 mmol/L (ref 98–110)
GFR, EST AFRICAN AMERICAN: 8 mL/min/{1.73_m2} — AB (ref >=60)
GFR, EST NON AFRICAN AMERICAN: 7 mL/min/{1.73_m2} — AB (ref >=60)
GLOBULIN: 3.6 g/dL (ref 1.9–3.7)
Glucose, Bld: 85 mg/dL (ref 65–139)
POTASSIUM: 5.3 mmol/L (ref 3.5–5.3)
SODIUM: 137 mmol/L (ref 135–146)
TOTAL PROTEIN: 6.3 g/dL (ref 6.1–8.1)
Total Bilirubin: 0.6 mg/dL (ref 0.2–1.2)

## 2017-01-13 LAB — LIPID PANEL
CHOLESTEROL: 94 mg/dL (ref <200)
HDL: 40 mg/dL — ABNORMAL LOW (ref >50)
LDL Cholesterol (Calc): 36 mg/dL (calc)
Non-HDL Cholesterol (Calc): 54 mg/dL (calc) (ref <130)
Total CHOL/HDL Ratio: 2.4 (calc) (ref <5.0)
Triglycerides: 93 mg/dL (ref <150)

## 2017-01-13 LAB — GAMMA GT: GGT: 16 U/L (ref 3–65)

## 2017-01-13 LAB — TSH: TSH: 0.03 mIU/L — ABNORMAL LOW (ref 0.40–4.50)

## 2017-01-13 LAB — T4, FREE: Free T4: 2.6 ng/dL — ABNORMAL HIGH (ref 0.8–1.8)

## 2017-01-15 ENCOUNTER — Other Ambulatory Visit: Payer: Self-pay | Admitting: *Deleted

## 2017-01-15 MED ORDER — LEVOTHYROXINE SODIUM 175 MCG PO TABS: 175.0000 ug | ORAL_TABLET | Freq: Every day | ORAL | 2 refills | Status: DC

## 2017-01-15 NOTE — Telephone Encounter (Signed)
Medication change due to labs. See labs dated 01/12/17. Per Dr. Eulas Post

## 2017-01-15 NOTE — Telephone Encounter (Signed)
Noted  

## 2017-01-16 ENCOUNTER — Telehealth: Payer: Self-pay | Admitting: *Deleted

## 2017-01-16 ENCOUNTER — Ambulatory Visit: Payer: Self-pay | Admitting: Internal Medicine

## 2017-01-16 NOTE — Telephone Encounter (Signed)
Caryl Pina notified and agreed.

## 2017-01-16 NOTE — Telephone Encounter (Signed)
Yes ok for verbal

## 2017-01-16 NOTE — Telephone Encounter (Signed)
Robin Hampton with [facility] called requesting verbal orders for Sacral wound care and Skilled nursing. I did not see a referral where we referred Home Health. Is this ok to give Verbal orders. Please Advise.

## 2017-01-23 ENCOUNTER — Telehealth: Payer: Self-pay | Admitting: *Deleted

## 2017-01-23 NOTE — Telephone Encounter (Signed)
Courtney Heys with [facility] called needing verbal orders for face to face. Missed appointment last week due to weather. Verbal order given.

## 2017-01-24 ENCOUNTER — Other Ambulatory Visit: Payer: Self-pay | Admitting: Internal Medicine

## 2017-01-24 DIAGNOSIS — R634 Abnormal weight loss: Secondary | ICD-10-CM

## 2017-01-24 DIAGNOSIS — F322 Major depressive disorder, single episode, severe without psychotic features: Secondary | ICD-10-CM

## 2017-01-24 MED ORDER — OMEPRAZOLE 20 MG PO CPDR: 20.0000 mg | DELAYED_RELEASE_CAPSULE | Freq: Every day | ORAL | 1 refills | Status: DC

## 2017-01-24 MED ORDER — OXYCODONE HCL 5 MG PO TABS: 5.0000 mg | ORAL_TABLET | Freq: Three times a day (TID) | ORAL | 0 refills | Status: DC | PRN

## 2017-01-24 MED ORDER — MIRTAZAPINE 15 MG PO TBDP: 15.0000 mg | ORAL_TABLET | Freq: Every day | ORAL | 1 refills | Status: DC

## 2017-01-24 MED ORDER — LEVOTHYROXINE SODIUM 175 MCG PO TABS: 175.0000 ug | ORAL_TABLET | Freq: Every day | ORAL | 1 refills | Status: AC

## 2017-01-24 NOTE — Telephone Encounter (Signed)
Melissa, daughter requested refill on everything.  [place] Database Verified.  Pended Rx for Dr. Eulas Post Approval.

## 2017-01-25 ENCOUNTER — Encounter: Payer: Self-pay | Admitting: Internal Medicine

## 2017-01-25 NOTE — Telephone Encounter (Signed)
Patient notified

## 2017-01-31 ENCOUNTER — Telehealth: Payer: Self-pay | Admitting: *Deleted

## 2017-01-31 NOTE — Telephone Encounter (Signed)
Nurse with [facility] called stating that patient is having issues with Hypotension and Dehydration.  Appointment scheduled with Janett Billow tomorrow.

## 2017-02-01 ENCOUNTER — Encounter: Payer: Self-pay | Admitting: Nurse Practitioner

## 2017-02-01 ENCOUNTER — Ambulatory Visit (INDEPENDENT_AMBULATORY_CARE_PROVIDER_SITE_OTHER): Payer: Medicare Other | Admitting: Nurse Practitioner

## 2017-02-01 VITALS — BP 80/52 | HR 90 | Temp 98.0°F | Resp 17 | Ht 62.0 in

## 2017-02-01 DIAGNOSIS — N185 Chronic kidney disease, stage 5: Secondary | ICD-10-CM | POA: Diagnosis not present

## 2017-02-01 DIAGNOSIS — E034 Atrophy of thyroid (acquired): Secondary | ICD-10-CM

## 2017-02-01 DIAGNOSIS — K219 Gastro-esophageal reflux disease without esophagitis: Secondary | ICD-10-CM | POA: Diagnosis not present

## 2017-02-01 DIAGNOSIS — E861 Hypovolemia: Secondary | ICD-10-CM

## 2017-02-01 DIAGNOSIS — I480 Paroxysmal atrial fibrillation: Secondary | ICD-10-CM | POA: Diagnosis not present

## 2017-02-01 DIAGNOSIS — L299 Pruritus, unspecified: Secondary | ICD-10-CM

## 2017-02-01 DIAGNOSIS — R634 Abnormal weight loss: Secondary | ICD-10-CM

## 2017-02-01 DIAGNOSIS — I9589 Other hypotension: Secondary | ICD-10-CM

## 2017-02-01 DIAGNOSIS — D631 Anemia in chronic kidney disease: Secondary | ICD-10-CM

## 2017-02-01 LAB — CBC WITH DIFFERENTIAL/PLATELET
BASOS ABS: 83 {cells}/uL (ref 0–200)
Basophils Relative: 0.7 %
EOS ABS: 366 {cells}/uL (ref 15–500)
Eosinophils Relative: 3.1 %
HCT: 32 % — ABNORMAL LOW (ref 35.0–45.0)
HEMOGLOBIN: 10.3 g/dL — AB (ref 11.7–15.5)
Lymphs Abs: 2962 cells/uL (ref 850–3900)
MCH: 28.5 pg (ref 27.0–33.0)
MCHC: 32.2 g/dL (ref 32.0–36.0)
MCV: 88.4 fL (ref 80.0–100.0)
MONOS PCT: 6.9 %
MPV: 11.4 fL (ref 7.5–12.5)
Neutro Abs: 7576 cells/uL (ref 1500–7800)
Neutrophils Relative %: 64.2 %
Platelets: 441 10*3/uL — ABNORMAL HIGH (ref 140–400)
RBC: 3.62 10*6/uL — ABNORMAL LOW (ref 3.80–5.10)
RDW: 12.1 % (ref 11.0–15.0)
TOTAL LYMPHOCYTE: 25.1 %
WBC: 11.8 10*3/uL — ABNORMAL HIGH (ref 3.8–10.8)
WBCMIX: 814 {cells}/uL (ref 200–950)

## 2017-02-01 LAB — COMPLETE METABOLIC PANEL WITH GFR
AG RATIO: 0.8 (calc) — AB (ref 1.0–2.5)
ALBUMIN MSPROF: 2.6 g/dL — AB (ref 3.6–5.1)
ALT: 6 U/L (ref 6–29)
AST: 14 U/L (ref 10–35)
Alkaline phosphatase (APISO): 87 U/L (ref 33–130)
BILIRUBIN TOTAL: 0.4 mg/dL (ref 0.2–1.2)
BUN / CREAT RATIO: 14 (calc) (ref 6–22)
BUN: 71 mg/dL — ABNORMAL HIGH (ref 7–25)
CALCIUM: 11.1 mg/dL — AB (ref 8.6–10.4)
CHLORIDE: 98 mmol/L (ref 98–110)
CO2: 30 mmol/L (ref 20–32)
Creat: 5.22 mg/dL — ABNORMAL HIGH (ref 0.60–0.93)
GFR, EST NON AFRICAN AMERICAN: 8 mL/min/{1.73_m2} — AB (ref >=60)
GFR, Est African American: 9 mL/min/{1.73_m2} — ABNORMAL LOW (ref >=60)
Globulin: 3.2 g/dL (calc) (ref 1.9–3.7)
Glucose, Bld: 95 mg/dL (ref 65–139)
POTASSIUM: 5.3 mmol/L (ref 3.5–5.3)
Sodium: 136 mmol/L (ref 135–146)
TOTAL PROTEIN: 5.8 g/dL — AB (ref 6.1–8.1)

## 2017-02-01 MED ORDER — FERROUS GLUCONATE 324 (38 FE) MG PO TABS: 324.0000 mg | ORAL_TABLET | Freq: Every day | ORAL | 3 refills | Status: DC

## 2017-02-01 MED ORDER — FAMOTIDINE 20 MG PO TABS: 20.0000 mg | ORAL_TABLET | Freq: Every day | ORAL | Status: DC | PRN

## 2017-02-01 MED ORDER — SODIUM BICARBONATE 650 MG PO TABS: 650.0000 mg | ORAL_TABLET | Freq: Three times a day (TID) | ORAL | 3 refills | Status: DC

## 2017-02-01 MED ORDER — CALCIUM ACETATE (PHOS BINDER) 667 MG PO CAPS: ORAL_CAPSULE | ORAL | 3 refills | Status: DC

## 2017-02-01 NOTE — Progress Notes (Signed)
Careteam: Patient Care Team: Gildardo Cranker, DO as PCP - General (Internal Medicine)  Advanced Directive information Does Patient Have a Medical Advance Directive?: No  Allergies  Allergen Reactions  . Phenergan [Promethazine]     Pulmonary arrest   . Codeine Nausea Only  . Sulfa Antibiotics     Patient states she is not allergic to Sulfa and has had the medication a few times without a reaction  . Trazodone And Nefazodone     Serotonin syndrome    Chief Complaint  Patient presents with  . Acute Visit    Pt is being seen for hypotension and possible dehydration. Family would like to discuss stopping lasix. Pt only drinks 12 to 14 oz of water per day. For last 2 and 1/2 weeks, pt is sleeping most of the day.   . Medication Refill    rx pended     HPI: Patient is a 71 y.o. female seen in the office today due to hypotension and possible dehydration. Daughter with pt today. Currently lives with daughter.  Home health reports blood pressure being 70/40. Intake poor.  Was previously on Cipro due to UTI.  [facility] [place]  Following with Dr Posey Pronto, nephrologist due to ESRD. Next appt coming up soon.  Barely drinking anything and on lasix. Barely urinating.  Pt reports she was placed on lasix a long time ago but it never agreed with her so she did not take then. Somehow she got placed back on this but unsure why she was placed on it- apparently this was after a hospitalization and her renal function has gotten worse since then.  Blood pressure has been dropping. Worse when she stands. Dizziness with position changes   Previously on coreg, hydralazine and amlodipine but when blood pressure started to drop she was taken off coreg, hydralazine and amlodipine was reduced by nephrologist  Does not stay awake, very lethargic but this is ongoing.  Was started on remeron to help with appetite which has helped. Does not feel like she is more drowsy on this  medication.  Taking benadryl twice daily due to itching- also on zyrtec   Recently TSH was low therefore synthroid was reduced. Follow up lab work scheduled for next month.   Does not have GERD no GI bleed, unsure why she is on omeprazole and  Pepcid.    Review of Systems:  Review of Systems  Constitutional: Positive for malaise/fatigue and weight loss. Negative for chills and fever.  Respiratory: Negative for cough, sputum production and shortness of breath.   Cardiovascular: Negative for chest pain, palpitations and leg swelling.  Gastrointestinal: Negative for abdominal pain, constipation, diarrhea and heartburn.  Genitourinary:       Minimal urination   Musculoskeletal: Negative for back pain, joint pain and myalgias.  Skin: Positive for itching.       Dry skin  Neurological: Positive for dizziness and weakness. Negative for headaches.  Psychiatric/Behavioral: Positive for memory loss. Negative for depression. The patient does not have insomnia.     Past Medical History:  Diagnosis Date  . Anemia   . Aortic atherosclerosis ([place])   . Chronic UTI (urinary tract infection)   . Clostridium difficile diarrhea   . Diastolic CHF ([place])   . E-coli UTI   . Encephalopathy   . Endometrial ca ([place])   . History of gastric bypass   . Hx of gastric bypass   . Hypertension   . Hypothyroidism   .  Kidney stones   . Nonrheumatic tricuspid valve disorder   . Orthostatic hypotension   . Osteoarthritis of back   . Osteoarthritis of knees, bilateral   . Paroxysmal supraventricular tachycardia ([place])   . Renal failure, chronic, stage 4 (severe) (HCC)   . Repeated falls   . Seizure ([place])   . Serotonin syndrome   . Severe depression ([place])   . Tachycardia   . Urinary retention    requiring self-catheterization  . Vaginal vault prolapse    Past Surgical History:  Procedure Laterality Date  . COLONOSCOPY  08/2015  . DG  BONE DENSITY ([place] [facility]X)  2013  . DIAGNOSTIC MAMMOGRAM  2013  .  GASTRIC BYPASS  1997   Dr. Greig Castilla  . INCONTINENCE SURGERY     Jefferey McQueary  . RECTOCELE REPAIR     Jefferey McQueary  . US KIDNEYS ([place] [facility]X)  12/2016   Dr. Eliberto Ivory [facility] ordered    Social History:   reports that she has quit smoking. Her smoking use included cigarettes. She quit after 25.00 years of use. she has never used smokeless tobacco. She reports that she drinks about 0.6 - 1.2 oz of alcohol per week. She reports that she does not use drugs.  Family History  Problem Relation Age of Onset  . Hypertension Mother   . Heart attack Mother   . Colon cancer Mother   . Hypothyroidism Mother   . Renal cancer Father   . CVA Father   . Hypertension Father   . CVA Brother   . Bipolar disorder Brother   . Depression Brother   . Hypertension Brother   . Osteoarthritis Brother   . Asthma Daughter   . Allergies Daughter     Medications:   Medication List        Accurate as of 02/01/17  2:29 PM. Always use your most recent med list.          acetaminophen 500 MG tablet Commonly known as:  TYLENOL   amLODipine 5 MG tablet Commonly known as:  NORVASC   calcitRIOL 0.25 MCG capsule Commonly known as:  ROCALTROL TAKE 1 CAPSULE BY MOUTH EVERY DAY   calcium acetate 667 MG capsule Commonly known as:  PHOSLO TAKE 2 CAPSULES BY MOUTH 3 TIMES A DAY WITH MEALS   cetirizine 10 MG tablet Commonly known as:  ZYRTEC   [employer] B-1 100 MG tablet Generic drug:  thiamine TAKE 1 TABLET BY MOUTH EVERY DAY   [employer] D3 1000 units capsule Generic drug:  Cholecalciferol TAKE ONE CAPSULE BY MOUTH EVERY DAY   diphenhydrAMINE 25 MG tablet Commonly known as:  BENADRYL   famotidine 20 MG tablet Commonly known as:  PEPCID   ferrous gluconate 324 MG tablet Commonly known as:  FERGON   furosemide 20 MG tablet Commonly known as:  LASIX   levothyroxine 175 MCG tablet Commonly known as:  SYNTHROID, LEVOTHROID Take 1 tablet (175 mcg total) by mouth daily before breakfast.     mirtazapine 15 MG disintegrating tablet Commonly known as:  REMERON SOL-TAB Take 1 tablet (15 mg total) by mouth at bedtime.   omeprazole 20 MG capsule Commonly known as:  PRILOSEC Take 1 capsule (20 mg total) by mouth daily.   oxyCODONE 5 MG immediate release tablet Commonly known as:  Oxy IR/ROXICODONE Take 1 tablet (5 mg total) by mouth every 8 (eight) hours as needed.   sodium bicarbonate 650 MG tablet   ULTIMATE PROBIOTIC FORMULA PO  Physical Exam:  Vitals:   02/01/17 1425  BP: (!) 80/52  Pulse: 90  Resp: 17  Temp: 98 F (36.7 C)  TempSrc: Oral  SpO2: 98%  Height: 5' 2"  (1.575 m)   Body mass index is 21.14 kg/m.  Physical Exam  Constitutional: She is oriented to person, place, and time. She appears cachectic. No distress.  Pale frail elderly female in [facility]  HENT:  Head: Normocephalic and atraumatic.  Mouth/Throat: Oropharynx is clear and moist.  Eyes: Conjunctivae are normal. Pupils are equal, round, and reactive to light.  Neck: Normal range of motion. Neck supple.  Cardiovascular: Normal rate. An irregularly irregular rhythm present.  Murmur heard. Pulmonary/Chest: Effort normal and breath sounds normal.  Abdominal: Soft. Bowel sounds are normal.  Musculoskeletal: She exhibits no edema or tenderness.  Neurological: She is alert and oriented to person, place, and time.  Skin: Skin is warm and dry. She is not diaphoretic.  Jaundice   Psychiatric: She has a normal mood and affect.    Labs reviewed: Basic Metabolic Panel: Recent Labs    10/14/16 01/12/17 1245  NA 140 137  K 3.0* 5.3  CL  --  99  CO2  --  27  GLUCOSE  --  85  BUN 52* 86*  CREATININE 5.7* 5.53*  CALCIUM  --  9.9  TSH  --  0.03*   Liver Function Tests: Recent Labs    10/14/16 01/12/17 1245  AST 214* 20  ALT 95* 12  ALKPHOS 244*  --   BILITOT  --  0.6  PROT  --  6.3   No results for input(s): LIPASE, AMYLASE in the last 8760 hours. No results for input(s): AMMONIA  in the last 8760 hours. CBC: Recent Labs    10/14/16 01/12/17 1245  WBC 9.3 12.6*  NEUTROABS  --  7,636  HGB 7.4* 11.0*  HCT 23* 34.2*  MCV  --  89.8  PLT 352 258   Lipid Panel: Recent Labs    01/12/17 1245  CHOL 94  HDL 40*  TRIG 93  CHOLHDL 2.4   TSH: Recent Labs    01/12/17 1245  TSH 0.03*   A1C: No results found for: HGBA1C   Assessment/Plan 1. Itching -currently taking benadryl routinely twice daily, to stop this as it is probably contributing to lethargy. Currently taking zyrtec daily    2. CKD (chronic kidney disease) stage 5, GFR less than 15 ml/min (HCC) -worsening renal disease, states that she does not want to start dialysis but feels like the nephrologist is leaning more that way has renal function worsens/fails to improve.  - calcium acetate (PHOSLO) 667 MG capsule; TAKE 2 CAPSULES BY MOUTH 3 TIMES A DAY WITH MEALS  Dispense: 90 capsule; Refill: 3 - sodium bicarbonate 650 MG tablet; Take 1 tablet (650 mg total) by mouth 3 (three) times daily.  Dispense: 90 tablet; Refill: 3  3. Hypothyroidism due to acquired atrophy of thyroid Recently TSH 0.03 and synthroid was reduced.   4. Paroxysmal atrial fibrillation (HCC) Rate control at this time. Not currently on anticoagulation again pt and family deny and bleeding history.   5. Hypotension due to hypovolemia -minimal intake with hypotension. Spoke with Dr Eulas Post and recommendation was to go to the hospital however pt is adamant she does not wish to go. Would like to stop blood pressure medication/lasix first and monitor.  States she will increase her oral intake in order not to go to the hospital. Discussed the necessity of  getting blood pressure up to profuse vital organs and she is aware of this but again does not want to go to the ED even if it means dying at home. Discussed comfort and palliative care and she reports at this time she would like to have more of a comfort approach however if things  progress/worsen would be willing to seek emergency medicine. Went into detail that it may be too late at that point but she is okay with this. Daughter in agreement -will stop lasix and norvasc at this time -pt with hx of CHF noted in epic so discussed fluid overload over time with pt and daughter. They were unaware of this hx.  - CBC with Differential/Platelets - CMP with eGFR  6. Gastroesophageal reflux disease without esophagitis -will stop omeprazole at this due do lack of symptoms. ?possible GI bleed due to not being on anticoagulant with hx of a fib and CVA however daughter and pt report she has NEVER had bleeding. Daughter plans to bring records into office for review. Question if she actual has had a bleed in the past and this is why she is on omeprazole and not on anticoagulation.  - famotidine (PEPCID) 20 MG tablet; Take 1 tablet (20 mg total) by mouth daily as needed for heartburn or indigestion.  7. Anemia due to stage 5 chronic kidney disease, not on chronic dialysis (HCC) - ferrous gluconate (FERGON) 324 MG tablet; Take 1 tablet (324 mg total) by mouth daily with breakfast.  Dispense: 30 tablet; Refill: 3  8. Weight loss Has been started on remeron without adverse effects, no increase in lethargy with this. Reports some increase in appetite.   Again stressed the importance to go to the ED for IV fluids and monitoring however pt is adamant she does not want to go at this time and she is aware of the potential adverse effects of not going. She is aware that myself and Dr Eulas Post are recommending her to to the ED. States she may go if she gets worse or does not improve.  To follow up in 1 week if she does not end up going to the ED   [place]. Harle Battiest  [facility] [phone] 8 am - 5 pm) [phone] (after hours)

## 2017-02-01 NOTE — Patient Instructions (Addendum)
STOP omeprazole, cont on pepcid- if no reflux then okay to stop pepcid and use as needed  STOP lasix  STOP benadryl  STOP norvasc/ amlodipine     To go to the hospital for IV fluids and monitoring

## 2017-02-08 ENCOUNTER — Telehealth: Payer: Self-pay | Admitting: *Deleted

## 2017-02-08 DIAGNOSIS — K219 Gastro-esophageal reflux disease without esophagitis: Secondary | ICD-10-CM

## 2017-02-08 MED ORDER — FAMOTIDINE 20 MG PO TABS: 20.0000 mg | ORAL_TABLET | Freq: Every day | ORAL | 1 refills | Status: DC | PRN

## 2017-02-08 NOTE — Telephone Encounter (Signed)
Patient daughter, Lenna Sciara called and stated that the Remeron has helped patient some but still not eating well and staying in bed a lot. Home Health has stated that patient has not made a lot of progress. Daughter is wanting to know if the Remeron can be increased. Please Advise.  Patient also needs a refill on her Famotidine. Faxed to pharmacy.

## 2017-02-08 NOTE — Telephone Encounter (Signed)
Increase remeron 30mg   #30 take 1 tab po qhs for appetite, sleep and depression. May take 2 tabs of 15mg  until bottle empty then pick up new Rx

## 2017-02-09 MED ORDER — MIRTAZAPINE 30 MG PO TBDP: 30.0000 mg | ORAL_TABLET | Freq: Every day | ORAL | 3 refills | Status: DC

## 2017-02-09 NOTE — Telephone Encounter (Signed)
Called and left message with Caregiver. Rx faxed to pharmacy.

## 2017-02-13 ENCOUNTER — Inpatient Hospital Stay: Payer: Medicare Other | Attending: Oncology

## 2017-02-13 ENCOUNTER — Telehealth: Payer: Self-pay | Admitting: Oncology

## 2017-02-13 ENCOUNTER — Inpatient Hospital Stay: Payer: Medicare Other | Attending: Oncology | Admitting: Oncology

## 2017-02-13 VITALS — BP 127/85 | HR 105 | Temp 98.5°F | Resp 18 | Ht 62.0 in | Wt 107.6 lb

## 2017-02-13 DIAGNOSIS — Z9884 Bariatric surgery status: Secondary | ICD-10-CM | POA: Diagnosis not present

## 2017-02-13 DIAGNOSIS — N19 Unspecified kidney failure: Secondary | ICD-10-CM

## 2017-02-13 DIAGNOSIS — E611 Iron deficiency: Secondary | ICD-10-CM | POA: Diagnosis not present

## 2017-02-13 DIAGNOSIS — E538 Deficiency of other specified B group vitamins: Secondary | ICD-10-CM | POA: Insufficient documentation

## 2017-02-13 DIAGNOSIS — Z720 Tobacco use: Secondary | ICD-10-CM | POA: Insufficient documentation

## 2017-02-13 DIAGNOSIS — Z8542 Personal history of malignant neoplasm of other parts of uterus: Secondary | ICD-10-CM | POA: Insufficient documentation

## 2017-02-13 DIAGNOSIS — D649 Anemia, unspecified: Secondary | ICD-10-CM | POA: Insufficient documentation

## 2017-02-13 NOTE — Progress Notes (Addendum)
Reason for Referral: Anemia  HPI: 72 year old woman native of [place] currently relocated to this area to live with her daughter.  She has history of renal failure, gastric bypass surgery as well as few other comorbid conditions.  She was living in [place] before her recent move to this area and was hospitalized in September 2018.  At that time she was found to be anemic with a hemoglobin of around 7 and require packed red cell transfusion at the time.  A repeat CBC done on January 12, 2017 showed a hemoglobin of 11, white cell count of 12.6 and a platelet count of 58.  Her hemoglobin was 10.3 on December 27.  Her creatinine is currently at 5.22.  He has been diagnosed with B12 deficiency and give herself injections.  Since her relocation to this area, she establish care with a primary care physician as well as nephrology.  She was evaluated by Dr. Posey Pronto for her renal failure and currently has not started dialysis.  She did have a serum protein electrophoresis and serum light chains.  She did have an elevated to both kappa and lambda light chains with a normal ratio.  Clinically, she reports major complaints is related to orthostasis and pruritus.  She does report some fatigue and tiredness but no tenderness or syncope.  She denies any chest pain or difficulty breathing.  She denies any pathological fractures.  She denies any recurrent infections.  She does not report any headaches, blurred vision, syncope or she does not report any fevers, chills, sweats but she does have poor appetite.  She does not report any chest pain, palpitation, orthopnea or leg edema.  She does not report any cough, wheezing or hemoptysis.  She does not report any nausea, vomiting abdominal pain, hematochezia or melena.  She does not report any frequency, urgency or hesitancy.  She does not report any skeletal complaints of arthralgias or myalgias.  She does not report any lymphadenopathy, petechiae or rashes.  She does not  report any skin lesions but does report pruritus.  She does report depression but no anxiety.  Remaining review of systems is negative.  Past Medical History:  Diagnosis Date  . Anemia   . Aortic atherosclerosis ([place])   . Chronic UTI (urinary tract infection)   . Clostridium difficile diarrhea   . Diastolic CHF ([place])   . E-coli UTI   . Encephalopathy   . Endometrial ca ([place])   . History of gastric bypass   . Hx of gastric bypass   . Hypertension   . Hypothyroidism   . Kidney stones   . Nonrheumatic tricuspid valve disorder   . Orthostatic hypotension   . Osteoarthritis of back   . Osteoarthritis of knees, bilateral   . Paroxysmal supraventricular tachycardia ([place])   . Renal failure, chronic, stage 4 (severe) (HCC)   . Repeated falls   . Seizure ([place])   . Serotonin syndrome   . Severe depression ([place])   . Tachycardia   . Urinary retention    requiring self-catheterization  . Vaginal vault prolapse   :  Past Surgical History:  Procedure Laterality Date  . COLONOSCOPY  08/2015  . DG  BONE DENSITY ([place] HX)  2013  . DIAGNOSTIC MAMMOGRAM  2013  . GASTRIC BYPASS  1997   Dr. Greig Castilla  . INCONTINENCE SURGERY     Jefferey McQueary  . RECTOCELE REPAIR     Jefferey McQueary  . US KIDNEYS ([place] [facility]X)  12/2016   Dr.  [facility] ordered   :   Current Outpatient Medications:  .  acetaminophen (TYLENOL) 500 MG tablet, Take 500 mg by mouth daily after lunch. , Disp: , Rfl:  .  amLODipine (NORVASC) 5 MG tablet, Take 5 mg by mouth daily., Disp: , Rfl:  .  calcitRIOL (ROCALTROL) 0.25 MCG capsule, TAKE 1 CAPSULE BY MOUTH EVERY DAY, Disp: 90 capsule, Rfl: 1 .  calcium acetate (PHOSLO) 667 MG capsule, TAKE 2 CAPSULES BY MOUTH 3 TIMES A DAY WITH MEALS, Disp: 90 capsule, Rfl: 3 .  cetirizine (ZYRTEC) 10 MG tablet, Take 10 mg by mouth daily., Disp: , Rfl:  .  CVS B-1 100 MG tablet, TAKE 1 TABLET BY MOUTH EVERY DAY, Disp: 90 tablet, Rfl: 1 .  CVS D3 1000 units capsule, TAKE ONE  CAPSULE BY MOUTH EVERY DAY, Disp: 90 capsule, Rfl: 1 .  famotidine (PEPCID) 20 MG tablet, Take 1 tablet (20 mg total) by mouth daily as needed for heartburn or indigestion., Disp: 90 tablet, Rfl: 1 .  ferrous gluconate (FERGON) 324 MG tablet, Take 1 tablet (324 mg total) by mouth daily with breakfast., Disp: 30 tablet, Rfl: 3 .  Lactobacillus (ULTIMATE PROBIOTIC FORMULA PO), Take 1 capsule by mouth daily., Disp: , Rfl:  .  levothyroxine (SYNTHROID, LEVOTHROID) 175 MCG tablet, Take 1 tablet (175 mcg total) by mouth daily before breakfast., Disp: 90 tablet, Rfl: 1 .  mirtazapine (REMERON SOL-TAB) 30 MG disintegrating tablet, Take 1 tablet (30 mg total) by mouth at bedtime., Disp: 30 tablet, Rfl: 3 .  omeprazole (PRILOSEC) 20 MG capsule, Take 1 capsule (20 mg total) by mouth daily., Disp: 90 capsule, Rfl: 1 .  oxyCODONE (OXY IR/ROXICODONE) 5 MG immediate release tablet, Take 1 tablet (5 mg total) by mouth every 8 (eight) hours as needed., Disp: 90 tablet, Rfl: 0 .  sodium bicarbonate 650 MG tablet, Take 1 tablet (650 mg total) by mouth 3 (three) times daily., Disp: 90 tablet, Rfl: 3:  Allergies  Allergen Reactions  . Phenergan [Promethazine]     Pulmonary arrest   . Codeine Nausea Only  . Sulfa Antibiotics     Patient states she is not allergic to Sulfa and has had the medication a few times without a reaction  . Trazodone And Nefazodone     Serotonin syndrome  :  Family History  Problem Relation Age of Onset  . Hypertension Mother   . Heart attack Mother   . Colon cancer Mother   . Hypothyroidism Mother   . Renal cancer Father   . CVA Father   . Hypertension Father   . CVA Brother   . Bipolar disorder Brother   . Depression Brother   . Hypertension Brother   . Osteoarthritis Brother   . Asthma Daughter   . Allergies Daughter   :  Social History   Socioeconomic History  . Marital status: Divorced    Spouse name: Not on file  . Number of children: Not on file  . Years of  education: Not on file  . Highest education level: Not on file  Social Needs  . Financial resource strain: Not on file  . Food insecurity - worry: Not on file  . Food insecurity - inability: Not on file  . Transportation needs - medical: Not on file  . Transportation needs - non-medical: Not on file  Occupational History  . Not on file  Tobacco Use  . Smoking status: Former Smoker    Years: 25.00  Types: Cigarettes  . Smokeless tobacco: Never Used  . Tobacco comment: social smoker in college up to late 30's  Substance and Sexual Activity  . Alcohol use: Yes    Alcohol/week: 0.6 - 1.2 oz    Types: 1 - 2 Standard drinks or equivalent per week    Frequency: Never    Comment: 2 days a week  . Drug use: No  . Sexual activity: Not Currently  Other Topics Concern  . Not on file  Social History Narrative   Social History      Diet? Renal diet; 6 lb weight loss since DC from [facility]      Do you drink/eat things with caffeine? Occasional coke 1-2 times per month      Marital status?        divorced                            What year were you married? 1966      Do you live in a house, apartment, assisted living, condo, trailer, etc.? Daughter's 3 story home on 2nd floor      Is it one or more stories? 3      How many persons live in your home? 5      Do you have any pets in your home? (please list) 6 cats, 1 dog      Highest level of education completed? 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       No, dizzy, too weak                               Type & how often? PT 2 times per week; at bedside- new onset orthostatic hypotension      Advanced Directives      Do you have a living will? yes      Do you have a DNR form?                                  If not, do you want to discuss one? yes      Do you have signed POA/HPOA for forms? yes      Functional Status      Do you have  difficulty bathing or dressing yourself? Yes- exhausts her      Do you have difficulty preparing food or eating? Yes- done for her      Do you have difficulty managing your medications? Yes- 2 years poor management; past CVA sometime 2017      Do you have difficulty managing your finances? Yes- daughter manages now- almost lost everything      Do you have difficulty affording your medications? Yes- recent boyfriend 59 years  stole retirement. A[place] involved in [place]  :  Pertinent items are noted in HPI.  Exam: Blood pressure 127/85, pulse (!) 105, temperature 98.5 F (36.9 C), temperature source Oral, resp. rate 18, height 5' 2"  (1.575 m), weight 107 lb 9.6 oz (48.8 kg), SpO2 100 %. General appearance: alert and cooperative Eyes: conjunctivae/corneas clear. PERRL, EOM's intact. Fundi benign. Throat: lips, mucosa, and tongue normal; teeth and gums normal Resp: clear to auscultation bilaterally Cardio: regular rate and  rhythm, S1, S2 normal, no murmur, click, rub or gallop GI: soft, non-tender; bowel sounds normal; no masses,  no organomegaly Pulses: 2+ and symmetric Skin: Skin color, texture, turgor normal. No rashes or lesions Lymph nodes: Cervical, supraclavicular, and axillary nodes normal. Neurologic: Grossly normal  CBC    Component Value Date/Time   WBC 11.8 (H) 02/01/2017 1523   RBC 3.62 (L) 02/01/2017 1523   HGB 10.3 (L) 02/01/2017 1523   HCT 32.0 (L) 02/01/2017 1523   PLT 441 (H) 02/01/2017 1523   MCV 88.4 02/01/2017 1523   MCH 28.5 02/01/2017 1523   MCHC 32.2 02/01/2017 1523   RDW 12.1 02/01/2017 1523   LYMPHSABS 2,962 02/01/2017 1523   EOSABS 366 02/01/2017 1523   BASOSABS 83 02/01/2017 1523     Chemistry      Component Value Date/Time   NA 136 02/01/2017 1523   NA 140 10/14/2016   K 5.3 02/01/2017 1523   CL 98 02/01/2017 1523   CO2 30 02/01/2017 1523   BUN 71 (H) 02/01/2017 1523   BUN 52 (A) 10/14/2016   CREATININE 5.22 (H) 02/01/2017 1523   GLU 62  10/14/2016      Component Value Date/Time   CALCIUM 11.1 (H) 02/01/2017 1523   ALKPHOS 244 (A) 10/14/2016   AST 14 02/01/2017 1523   ALT 6 02/01/2017 1523   BILITOT 0.4 02/01/2017 1523       Assessment and Plan:    72 year old woman with the following issues:  1.  Anemia: Appears to be multifactorial in nature with element of B12 deficiency, iron deficiency and renal failure.  Her hemoglobin on February 01, 2017 was 10.3 with normal indices.  Other considerations would include plasma cell disorder, myelodysplastic syndrome among others.  Other conditions such as leukemia or lymphoma considered extremely unlikely.  From a management standpoint, I will repeat iron studies and B12 levels to ensure adequate stores at this time and replace as needed.  There is troponin replacement may be needed as well after iron levels have been restored.  Complication associated with Aranesp therapy were reviewed today she is agreeable to proceed with that if not prescribed by Dr. Posey Pronto in the future.  2.  Iron deficiency: She has been on oral iron in the past and have received intravenous iron as well.  Risks and benefits associated with Feraheme infusion was discussed.  These complications include arthralgias, myalgias and infusion related complications.  We will check iron levels today and replace as needed.  3.  B12 deficiency: Related to gastric bypass and poor absorption.  I will check B12 levels today and give her the option of continuing B12 injections versus oral replacement.  She prefers the B12 injections and capable of giving her self the injections.  She will be prescribed that pending the results of her levels.  4.  Elevated serum kappa and lambda light chains: These findings do not support the finding of a plasma cell disorder.  Multiple myeloma, MGUS or amyloidosis are considered less likely.  Her ratio is within normal range which supports the finding of renal failure rather than a plasma  cell disorder.  5.  Renal failure: Not related to plasma cell disorder at this time.  She is under consideration to start dialysis in the future.  6.  Follow-up: We will be in 3 months to follow her progress.  60 minutes was spent face-to-face with the patient with greater than 50% of the time spent in counseling and coordination of care.  Some of the history was also provided by her daughter who is the primary caregiver for the patient.

## 2017-02-13 NOTE — Telephone Encounter (Signed)
Gave avs and calendar for april °

## 2017-02-14 LAB — IRON AND TIBC
IRON: 42 ug/dL (ref 41–142)
SATURATION RATIOS: 31 % (ref 21–57)
TIBC: 136 ug/dL — AB (ref 236–444)
UIBC: 94 ug/dL

## 2017-02-14 LAB — FERRITIN: Ferritin: 327 ng/mL — ABNORMAL HIGH (ref 9–269)

## 2017-02-16 LAB — METHYLMALONIC ACID, SERUM: Methylmalonic Acid, Quantitative: 1356 nmol/L — ABNORMAL HIGH (ref 0–378)

## 2017-02-22 ENCOUNTER — Other Ambulatory Visit: Payer: Self-pay

## 2017-02-22 DIAGNOSIS — N185 Chronic kidney disease, stage 5: Secondary | ICD-10-CM

## 2017-02-22 DIAGNOSIS — Z01810 Encounter for preprocedural cardiovascular examination: Secondary | ICD-10-CM

## 2017-02-23 ENCOUNTER — Other Ambulatory Visit: Payer: Self-pay | Admitting: Nephrology

## 2017-02-23 DIAGNOSIS — J9859 Other diseases of mediastinum, not elsewhere classified: Secondary | ICD-10-CM

## 2017-02-28 ENCOUNTER — Encounter: Payer: Medicare Other | Admitting: Internal Medicine

## 2017-03-01 ENCOUNTER — Encounter: Payer: Self-pay | Admitting: Vascular Surgery

## 2017-03-01 ENCOUNTER — Ambulatory Visit (HOSPITAL_COMMUNITY)
Admission: RE | Admit: 2017-03-01 | Discharge: 2017-03-01 | Disposition: A | Discharge status: Home / Self Care | Payer: Medicare Other | Source: Ambulatory Visit | Attending: Vascular Surgery | Admitting: Vascular Surgery

## 2017-03-01 ENCOUNTER — Ambulatory Visit (INDEPENDENT_AMBULATORY_CARE_PROVIDER_SITE_OTHER): Payer: Medicare Other | Admitting: Vascular Surgery

## 2017-03-01 ENCOUNTER — Other Ambulatory Visit: Payer: Self-pay

## 2017-03-01 ENCOUNTER — Other Ambulatory Visit: Payer: Self-pay | Admitting: *Deleted

## 2017-03-01 ENCOUNTER — Ambulatory Visit
Admission: RE | Admit: 2017-03-01 | Discharge: 2017-03-01 | Disposition: A | Discharge status: Home / Self Care | Payer: Medicare Other | Source: Ambulatory Visit | Attending: Nephrology | Admitting: Nephrology

## 2017-03-01 ENCOUNTER — Ambulatory Visit (INDEPENDENT_AMBULATORY_CARE_PROVIDER_SITE_OTHER)
Admission: RE | Admit: 2017-03-01 | Discharge: 2017-03-01 | Disposition: A | Discharge status: Home / Self Care | Payer: Medicare Other | Source: Ambulatory Visit | Attending: Vascular Surgery | Admitting: Vascular Surgery

## 2017-03-01 ENCOUNTER — Encounter: Payer: Self-pay | Admitting: *Deleted

## 2017-03-01 VITALS — BP 102/72 | HR 86 | Temp 98.6°F | Resp 14 | Ht 63.0 in | Wt 102.0 lb

## 2017-03-01 DIAGNOSIS — N186 End stage renal disease: Secondary | ICD-10-CM | POA: Diagnosis not present

## 2017-03-01 DIAGNOSIS — Z01810 Encounter for preprocedural cardiovascular examination: Secondary | ICD-10-CM | POA: Diagnosis not present

## 2017-03-01 DIAGNOSIS — N185 Chronic kidney disease, stage 5: Secondary | ICD-10-CM | POA: Insufficient documentation

## 2017-03-01 DIAGNOSIS — Z992 Dependence on renal dialysis: Secondary | ICD-10-CM | POA: Diagnosis not present

## 2017-03-01 NOTE — Progress Notes (Signed)
[facility] [place] HISTORY AND PHYSICAL   History of Present Illness:  Patient is a 72 y.o. year old female who presents for placement of a permanent hemodialysis access. The patient is right handed.  The patient is currently on hemodialysis.  She dialyzes Monday Wednesday Friday.  She has a right-sided catheter.  Other chronic medical problems include anemia, congestive heart failure, hypertension, all of which are currently stable.  She had a reaction to the Phenergan in the past where she stopped breathing.  She would prefer to avoid general anesthesia if possible.  Past Medical History:  Diagnosis Date  . Anal sphincter incompetence   . Anemia   . Aortic atherosclerosis ([place])   . Chronic UTI (urinary tract infection)   . Clostridium difficile diarrhea   . Diastolic CHF ([place])   . E-coli UTI   . Encephalopathy   . Endometrial ca ([place])   . Hearing loss   . Hematuria   . High risk medication use   . History of gastric bypass   . Hx of gastric bypass   . Hypertension   . Hypocalcemia   . Hypothyroidism   . Insomnia   . Kidney stones   . Midline cystocele   . Nonrheumatic tricuspid valve disorder   . Opioid dependence ([place])    continuous  . Orthostatic hypotension   . Osteoarthritis of back   . Osteoarthritis of knees, bilateral   . Paroxysmal supraventricular tachycardia ([place])   . Rectocele   . Renal failure, chronic, stage 4 (severe) (HCC)   . Renovascular hypertension   . Repeated falls   . Seizure ([place])   . Sensory urge incontinence   . Serotonin syndrome   . Severe depression ([place])   . Short-term memory loss   . Tachycardia   . Urinary retention    requiring self-catheterization  . Vaginal vault prolapse     Past Surgical History:  Procedure Laterality Date  . ANAL SPHINCTEROPLASTY    . ANTERIOR AND POSTERIOR VAGINAL REPAIR    . ANTERIOR AND POSTERIOR VAGINAL REPAIR W/ SACROSPINOUS LIGAMENT SUSPENSION    . anterior coloporrhaphy     with uphold light  . COLONOSCOPY  08/2015  . CYSTOSCOPY    . DG  BONE DENSITY ([place] HX)  2013  . DIAGNOSTIC MAMMOGRAM  2013  . GASTRIC BYPASS  1997   Dr. Greig Castilla  . INCONTINENCE SURGERY     Jefferey McQueary  . LAPAROSCOPIC HYSTERECTOMY    . RECTOCELE REPAIR     Jefferey McQueary  . US KIDNEYS ([place] HX)  12/2016   Dr. Eliberto [facility] ordered      Social History Social History   Tobacco Use  . Smoking status: Former Smoker    Years: 25.00    Types: Cigarettes  . Smokeless tobacco: Never Used  . Tobacco comment: social smoker in college up to late 30's  Substance Use Topics  . Alcohol use: Yes    Alcohol/week: 0.6 - 1.2 oz    Types: 1 - 2 Standard drinks or equivalent per week    Frequency: Never    Comment: 2 days a week  . Drug use: No    Family History Family History  Problem Relation Age of Onset  . Hypertension Mother   . Heart attack Mother   . Colon cancer Mother   . Hypothyroidism Mother   . Renal cancer Father   . CVA Father   . Hypertension Father   . CVA  Brother   . Bipolar disorder Brother   . Depression Brother   . Hypertension Brother   . Osteoarthritis Brother   . Asthma Daughter   . Allergies Daughter     Allergies  Allergies  Allergen Reactions  . Phenergan [Promethazine]     Pulmonary arrest   . Codeine Nausea Only  . Sulfa Antibiotics     Patient states she is not allergic to Sulfa and has had the medication a few times without a reaction  . Trazodone And Nefazodone     Serotonin syndrome     Current Outpatient Medications  Medication Sig Dispense Refill  . acetaminophen (TYLENOL) 500 MG tablet Take 500 mg by mouth daily after lunch.     Marland Kitchen amLODipine (NORVASC) 5 MG tablet Take 5 mg by mouth daily.    . cetirizine (ZYRTEC) 10 MG tablet Take 10 mg by mouth daily.    . CVS B-1 100 MG tablet TAKE 1 TABLET BY MOUTH EVERY DAY 90 tablet 1  . famotidine (PEPCID) 20 MG tablet Take 1 tablet (20 mg total) by mouth daily as needed  for heartburn or indigestion. 90 tablet 1  . levothyroxine (SYNTHROID, LEVOTHROID) 175 MCG tablet Take 1 tablet (175 mcg total) by mouth daily before breakfast. 90 tablet 1  . mirtazapine (REMERON SOL-TAB) 30 MG disintegrating tablet Take 1 tablet (30 mg total) by mouth at bedtime. 30 tablet 3  . oxyCODONE (OXY IR/ROXICODONE) 5 MG immediate release tablet Take 1 tablet (5 mg total) by mouth every 8 (eight) hours as needed. 90 tablet 0  . sodium bicarbonate 650 MG tablet Take 1 tablet (650 mg total) by mouth 3 (three) times daily. 90 tablet 3  . calcitRIOL (ROCALTROL) 0.25 MCG capsule TAKE 1 CAPSULE BY MOUTH EVERY DAY (Patient not taking: Reported on 03/01/2017) 90 capsule 1  . calcium acetate (PHOSLO) 667 MG capsule TAKE 2 CAPSULES BY MOUTH 3 TIMES A DAY WITH MEALS (Patient not taking: Reported on 03/01/2017) 90 capsule 3  . CVS D3 1000 units capsule TAKE ONE CAPSULE BY MOUTH EVERY DAY (Patient not taking: Reported on 03/01/2017) 90 capsule 1  . ferrous gluconate (FERGON) 324 MG tablet Take 1 tablet (324 mg total) by mouth daily with breakfast. (Patient not taking: Reported on 03/01/2017) 30 tablet 3  . Lactobacillus (ULTIMATE PROBIOTIC FORMULA PO) Take 1 capsule by mouth daily.    Marland Kitchen omeprazole (PRILOSEC) 20 MG capsule Take 1 capsule (20 mg total) by mouth daily. (Patient not taking: Reported on 03/01/2017) 90 capsule 1   No current facility-administered medications for this visit.     ROS:   General:  No weight loss, Fever, chills  HEENT: No recent headaches, no nasal bleeding, no visual changes, no sore throat  Neurologic: No dizziness, blackouts, seizures. No recent symptoms of stroke or mini- stroke. No recent episodes of slurred speech, or temporary blindness.  Cardiac: No recent episodes of chest pain/pressure, no shortness of breath at rest.  +shortness of breath with exertion.  Denies history of atrial fibrillation or irregular heartbeat  Vascular: No history of rest pain in feet.  No  history of claudication.  No history of non-healing ulcer, No history of DVT   Pulmonary: No home oxygen, no productive cough, no hemoptysis,  No asthma or wheezing  Musculoskeletal:  _0  Arthritis, _1  Low back pain,  _2  Joint pain  Hematologic:No history of hypercoagulable state.  No history of easy bleeding.  No history of anemia  Gastrointestinal: No  hematochezia or melena,  No gastroesophageal reflux, no trouble swallowing  Urinary: [x ] chronic Kidney disease, [x ] on HD - [x ] MWF or _0  TTHS, _1  Burning with urination, _2  Frequent urination, _3  Difficulty urinating;   Skin: No rashes  Psychological: No history of anxiety,  + history of depression   Physical Examination  Vitals:   03/01/17 1127  BP: 102/72  Pulse: 86  Resp: 14  Temp: 98.6 F (37 C)  TempSrc: Oral  SpO2: 96%  Weight: 102 lb (46.3 kg)  Height: _4  (1.6 m)    Body mass index is 18.07 kg/m.  General:  Alert and oriented, no acute distress HEENT: Normal Neck: No bruit or JVD Pulmonary: Clear to auscultation bilaterally Cardiac: Regular Rate and Rhythm without murmur Gastrointestinal: Soft, non-tender, non-distended, no mass Skin: No rash Extremity Pulses:  2+ radial, brachial pulses bilaterally Musculoskeletal: No deformity or edema  Neurologic: Upper and lower extremity motor 5/5 and symmetric  DATA: She had an upper extremity arterial duplex exam today which showed normal anatomic configuration less than 2 mm radial artery bilaterally 6 mm right brachial artery with bifurcation in the mid upper arm 3 mm left brachial artery with bifurcation at the antecubital level.  She also had bilateral upper extremity vein mapping.  Cephalic vein was of poor quality bilaterally less than 2 mm.  The left basilic vein was a short segment only 3 mm.  Right basilic vein was 4-6 mm.  ASSESSMENT: Patient needs long-term hemodialysis access.  Although she has a high brachial bifurcation on the right side the  brachial artery seems to be 6 mm in diameter which should be adequate.  The basilic vein is her best quality conduit for creation of a fistula.  This is in the right arm.  I discussed her today the risk benefits possible complications and procedure details including but not limited to bleeding infection fistula thrombosis non-maturation ischemic steal.  She understands and agrees to proceed.  Due to her prior worries about general anesthesia we will try to do this with local and MAC.  She understands that she may have to go all the way to sleep with general anesthesia if she does not tolerate this.  PLAN: Right basilic vein transposition fistula March 07, 2017.  We will rearrange the patient's dialysis day to accommodate this.  Ruta Hinds, MD [facility] Office: [phone] Pager: [phone]

## 2017-03-01 NOTE — H&P (View-Only) (Signed)
[facility] [place] HISTORY AND PHYSICAL   History of Present Illness:  Robin Hampton is a 72 y.o. year old female who presents for placement of a permanent hemodialysis access. The Robin Hampton is right handed.  The Robin Hampton is currently on hemodialysis.  She dialyzes Monday Wednesday Friday.  She has a right-sided catheter.  Other chronic medical problems include anemia, congestive heart failure, hypertension, all of which are currently stable.  She had a reaction to the Phenergan in the past where she stopped breathing.  She would prefer to avoid general anesthesia if possible.  Past Medical History:  Diagnosis Date  . Anal sphincter incompetence   . Anemia   . Aortic atherosclerosis ([place])   . Chronic UTI (urinary tract infection)   . Clostridium difficile diarrhea   . Diastolic CHF ([place])   . E-coli UTI   . Encephalopathy   . Endometrial ca ([place])   . Hearing loss   . Hematuria   . High risk medication use   . History of gastric bypass   . Hx of gastric bypass   . Hypertension   . Hypocalcemia   . Hypothyroidism   . Insomnia   . Kidney stones   . Midline cystocele   . Nonrheumatic tricuspid valve disorder   . Opioid dependence ([place])    continuous  . Orthostatic hypotension   . Osteoarthritis of back   . Osteoarthritis of knees, bilateral   . Paroxysmal supraventricular tachycardia ([place])   . Rectocele   . Renal failure, chronic, stage 4 (severe) (HCC)   . Renovascular hypertension   . Repeated falls   . Seizure ([place])   . Sensory urge incontinence   . Serotonin syndrome   . Severe depression ([place])   . Short-term memory loss   . Tachycardia   . Urinary retention    requiring self-catheterization  . Vaginal vault prolapse     Past Surgical History:  Procedure Laterality Date  . ANAL SPHINCTEROPLASTY    . ANTERIOR AND POSTERIOR VAGINAL REPAIR    . ANTERIOR AND POSTERIOR VAGINAL REPAIR W/ SACROSPINOUS LIGAMENT SUSPENSION    . anterior coloporrhaphy     with uphold light  . COLONOSCOPY  08/2015  . CYSTOSCOPY    . DG  BONE DENSITY ([place] HX)  2013  . DIAGNOSTIC MAMMOGRAM  2013  . GASTRIC BYPASS  1997   Dr. Greig Castilla  . INCONTINENCE SURGERY     Jefferey McQueary  . LAPAROSCOPIC HYSTERECTOMY    . RECTOCELE REPAIR     Jefferey McQueary  . US KIDNEYS ([place] HX)  12/2016   Dr. Eliberto Ivory Kidney ordered      Social History Social History   Tobacco Use  . Smoking status: Former Smoker    Years: 25.00    Types: Cigarettes  . Smokeless tobacco: Never Used  . Tobacco comment: social smoker in college up to late 30's  Substance Use Topics  . Alcohol use: Yes    Alcohol/week: 0.6 - 1.2 oz    Types: 1 - 2 Standard drinks or equivalent per week    Frequency: Never    Comment: 2 days a week  . Drug use: No    Family History Family History  Problem Relation Age of Onset  . Hypertension Mother   . Heart attack Mother   . Colon cancer Mother   . Hypothyroidism Mother   . Renal cancer Father   . CVA Father   . Hypertension Father   . CVA  Brother   . Bipolar disorder Brother   . Depression Brother   . Hypertension Brother   . Osteoarthritis Brother   . Asthma Daughter   . Allergies Daughter     Allergies  Allergies  Allergen Reactions  . Phenergan [Promethazine]     Pulmonary arrest   . Codeine Nausea Only  . Sulfa Antibiotics     Robin Hampton states she is not allergic to Sulfa and has had the medication a few times without a reaction  . Trazodone And Nefazodone     Serotonin syndrome     Current Outpatient Medications  Medication Sig Dispense Refill  . acetaminophen (TYLENOL) 500 MG tablet Take 500 mg by mouth daily after lunch.     Marland Kitchen amLODipine (NORVASC) 5 MG tablet Take 5 mg by mouth daily.    . cetirizine (ZYRTEC) 10 MG tablet Take 10 mg by mouth daily.    . [facility]S B-1 100 MG tablet TAKE 1 TABLET BY MOUTH EVERY DAY 90 tablet 1  . famotidine (PEPCID) 20 MG tablet Take 1 tablet (20 mg total) by mouth daily as needed  for heartburn or indigestion. 90 tablet 1  . levothyroxine (SYNTHROID, LEVOTHROID) 175 MCG tablet Take 1 tablet (175 mcg total) by mouth daily before breakfast. 90 tablet 1  . mirtazapine (REMERON SOL-TAB) 30 MG disintegrating tablet Take 1 tablet (30 mg total) by mouth at bedtime. 30 tablet 3  . oxyCODONE (OXY IR/ROXICODONE) 5 MG immediate release tablet Take 1 tablet (5 mg total) by mouth every 8 (eight) hours as needed. 90 tablet 0  . sodium bicarbonate 650 MG tablet Take 1 tablet (650 mg total) by mouth 3 (three) times daily. 90 tablet 3  . calcitRIOL (ROCALTROL) 0.25 MCG capsule TAKE 1 CAPSULE BY MOUTH EVERY DAY (Robin Hampton not taking: Reported on 03/01/2017) 90 capsule 1  . calcium acetate (PHOSLO) 667 MG capsule TAKE 2 CAPSULES BY MOUTH 3 TIMES A DAY WITH MEALS (Robin Hampton not taking: Reported on 03/01/2017) 90 capsule 3  . CVS D3 1000 units capsule TAKE ONE CAPSULE BY MOUTH EVERY DAY (Robin Hampton not taking: Reported on 03/01/2017) 90 capsule 1  . ferrous gluconate (FERGON) 324 MG tablet Take 1 tablet (324 mg total) by mouth daily with breakfast. (Robin Hampton not taking: Reported on 03/01/2017) 30 tablet 3  . Lactobacillus (ULTIMATE PROBIOTIC FORMULA PO) Take 1 capsule by mouth daily.    Marland Kitchen omeprazole (PRILOSEC) 20 MG capsule Take 1 capsule (20 mg total) by mouth daily. (Robin Hampton not taking: Reported on 03/01/2017) 90 capsule 1   No current facility-administered medications for this visit.     ROS:   General:  No weight loss, Fever, chills  HEENT: No recent headaches, no nasal bleeding, no visual changes, no sore throat  Neurologic: No dizziness, blackouts, seizures. No recent symptoms of stroke or mini- stroke. No recent episodes of slurred speech, or temporary blindness.  Cardiac: No recent episodes of chest pain/pressure, no shortness of breath at rest.  +shortness of breath with exertion.  Denies history of atrial fibrillation or irregular heartbeat  Vascular: No history of rest pain in feet.  No  history of claudication.  No history of non-healing ulcer, No history of DVT   Pulmonary: No home oxygen, no productive cough, no hemoptysis,  No asthma or wheezing  Musculoskeletal:  _0  Arthritis, _1  Low back pain,  _2  Joint pain  Hematologic:No history of hypercoagulable state.  No history of easy bleeding.  No history of anemia  Gastrointestinal: No  hematochezia or melena,  No gastroesophageal reflux, no trouble swallowing  Urinary: [x ] chronic Kidney disease, [x ] on HD - [x ] MWF or _0  TTHS, _1  Burning with urination, _2  Frequent urination, _3  Difficulty urinating;   Skin: No rashes  Psychological: No history of anxiety,  + history of depression   Physical Examination  Vitals:   03/01/17 1127  BP: 102/72  Pulse: 86  Resp: 14  Temp: 98.6 F (37 C)  TempSrc: Oral  SpO2: 96%  Weight: 102 lb (46.3 kg)  Height: _4  (1.6 m)    Body mass index is 18.07 kg/m.  General:  Alert and oriented, no acute distress HEENT: Normal Neck: No bruit or JVD Pulmonary: Clear to auscultation bilaterally Cardiac: Regular Rate and Rhythm without murmur Gastrointestinal: Soft, non-tender, non-distended, no mass Skin: No rash Extremity Pulses:  2+ radial, brachial pulses bilaterally Musculoskeletal: No deformity or edema  Neurologic: Upper and lower extremity motor 5/5 and symmetric  DATA: She had an upper extremity arterial duplex exam today which showed normal anatomic configuration less than 2 mm radial artery bilaterally 6 mm right brachial artery with bifurcation in the mid upper arm 3 mm left brachial artery with bifurcation at the antecubital level.  She also had bilateral upper extremity vein mapping.  Cephalic vein was of poor quality bilaterally less than 2 mm.  The left basilic vein was a short segment only 3 mm.  Right basilic vein was 4-6 mm.  ASSESSMENT: Robin Hampton needs long-term hemodialysis access.  Although she has a high brachial bifurcation on the right side the  brachial artery seems to be 6 mm in diameter which should be adequate.  The basilic vein is her best quality conduit for creation of a fistula.  This is in the right arm.  I discussed her today the risk benefits possible complications and procedure details including but not limited to bleeding infection fistula thrombosis non-maturation ischemic steal.  She understands and agrees to proceed.  Due to her prior worries about general anesthesia we will try to do this with local and MAC.  She understands that she may have to go all the way to sleep with general anesthesia if she does not tolerate this.  PLAN: Right basilic vein transposition fistula March 07, 2017.  We will rearrange the Robin Hampton's dialysis day to accommodate this.  Ruta Hinds, MD [facility] [place] Office: [phone] Pager: [phone]

## 2017-03-02 ENCOUNTER — Encounter: Payer: Medicare Other | Admitting: Internal Medicine

## 2017-03-02 ENCOUNTER — Encounter: Payer: Self-pay | Admitting: Internal Medicine

## 2017-03-02 ENCOUNTER — Ambulatory Visit (INDEPENDENT_AMBULATORY_CARE_PROVIDER_SITE_OTHER): Payer: Medicare Other | Admitting: Internal Medicine

## 2017-03-02 VITALS — BP 120/70 | HR 78 | Temp 97.7°F | Wt 105.0 lb

## 2017-03-02 DIAGNOSIS — Z0000 Encounter for general adult medical examination without abnormal findings: Secondary | ICD-10-CM

## 2017-03-02 DIAGNOSIS — F322 Major depressive disorder, single episode, severe without psychotic features: Secondary | ICD-10-CM

## 2017-03-02 DIAGNOSIS — D808 Other immunodeficiencies with predominantly antibody defects: Secondary | ICD-10-CM

## 2017-03-02 DIAGNOSIS — I480 Paroxysmal atrial fibrillation: Secondary | ICD-10-CM

## 2017-03-02 DIAGNOSIS — Z992 Dependence on renal dialysis: Secondary | ICD-10-CM | POA: Diagnosis not present

## 2017-03-02 DIAGNOSIS — N186 End stage renal disease: Secondary | ICD-10-CM | POA: Diagnosis not present

## 2017-03-02 DIAGNOSIS — D8989 Other specified disorders involving the immune mechanism, not elsewhere classified: Secondary | ICD-10-CM

## 2017-03-02 DIAGNOSIS — L853 Xerosis cutis: Secondary | ICD-10-CM | POA: Diagnosis not present

## 2017-03-02 DIAGNOSIS — E034 Atrophy of thyroid (acquired): Secondary | ICD-10-CM

## 2017-03-02 DIAGNOSIS — D631 Anemia in chronic kidney disease: Secondary | ICD-10-CM | POA: Insufficient documentation

## 2017-03-02 LAB — T4, FREE: FREE T4: 1.4 ng/dL (ref 0.8–1.8)

## 2017-03-02 LAB — TSH: TSH: 0.09 m[IU]/L — AB (ref 0.40–4.50)
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
## 2017-03-02 NOTE — Patient Instructions (Addendum)
START OTC NO RINSE BEDSIDE CLEANSER (COLOPLAST) - SENSITIVE SKIN FOAMING CLEANSER - [facility] be [facility]  Continue current medications as ordered  Will call with lab results  Keep scheduled appts with specialists  Follow up in 3 mos for PAF, memory loss, HTN

## 2017-03-05 ENCOUNTER — Other Ambulatory Visit: Payer: Self-pay | Admitting: *Deleted

## 2017-03-06 ENCOUNTER — Ambulatory Visit
Admission: RE | Admit: 2017-03-06 | Discharge: 2017-03-06 | Disposition: A | Discharge status: Home / Self Care | Payer: Medicare Other | Source: Ambulatory Visit | Attending: Nephrology | Admitting: Nephrology

## 2017-03-06 ENCOUNTER — Encounter: Payer: Self-pay | Admitting: Internal Medicine

## 2017-03-06 ENCOUNTER — Encounter (HOSPITAL_COMMUNITY): Payer: Self-pay | Admitting: *Deleted

## 2017-03-06 DIAGNOSIS — J9859 Other diseases of mediastinum, not elsewhere classified: Secondary | ICD-10-CM

## 2017-03-06 MED ORDER — IOPAMIDOL (ISOVUE-300) INJECTION 61%
75.0000 mL | Freq: Once | INTRAVENOUS | Status: AC | PRN
  Administered 2017-03-06: 75 mL via INTRAVENOUS

## 2017-03-06 NOTE — Progress Notes (Signed)
Anesthesia Chart Review:  Pt is a same day work up  Pt is a 72 year old female scheduled for R basilic vein transposition, insertion of dialysis catheter, central venogram on 03/07/2017 with Ruta Hinds, MD  - PCP is Gildardo Cranker, DO. Last office visit 03/02/17. Note documents hx PAF but this is not listed in pt's history. 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 Former smoker. BMI 18.5. S/p gastric bypass.   - by notes, moved from [place] to  last fall.  In 10/2016 was hospitalized for failure to thrive, anemia (hgb 7s), AKI  Medications include: Amlodipine, Pepcid, Lasix, levothyroxine  Labs will be obtained day of surgery  EKG will be obtained day of surgery.   Pt with memory loss reports to pre-admission testing RN she has daily SOB and cannot lie flat.   Pt will need further assessment by assigned anesthesiologist day of surgery.   Willeen Cass, FNP-BC [facility]/Anesthesiology Phone: [phone] 03/06/2017 4:40 PM

## 2017-03-06 NOTE — Progress Notes (Signed)
Spoke with daughter for preop phone call. She reports that patient gets short of breath daily and is unable to lay flat. Notified Levada Dy, FNP of history.

## 2017-03-06 NOTE — Progress Notes (Signed)
PCP has send EKG to be scanned in and no longer has paper copy

## 2017-03-07 ENCOUNTER — Ambulatory Visit (HOSPITAL_COMMUNITY)
Admission: RE | Admit: 2017-03-07 | Discharge: 2017-03-07 | Disposition: A | Discharge status: Home / Self Care | Payer: Medicare Other | Source: Ambulatory Visit | Attending: Vascular Surgery | Admitting: Vascular Surgery

## 2017-03-07 ENCOUNTER — Ambulatory Visit (HOSPITAL_COMMUNITY): Payer: Medicare Other | Admitting: Emergency Medicine

## 2017-03-07 ENCOUNTER — Encounter (HOSPITAL_COMMUNITY)
Admission: RE | Disposition: A | Discharge status: Home / Self Care | Payer: Self-pay | Source: Ambulatory Visit | Attending: Vascular Surgery

## 2017-03-07 ENCOUNTER — Encounter (HOSPITAL_COMMUNITY): Payer: Self-pay | Admitting: *Deleted

## 2017-03-07 ENCOUNTER — Ambulatory Visit (HOSPITAL_COMMUNITY): Payer: Medicare Other

## 2017-03-07 ENCOUNTER — Telehealth: Payer: Self-pay | Admitting: Vascular Surgery

## 2017-03-07 ENCOUNTER — Other Ambulatory Visit: Payer: Self-pay

## 2017-03-07 DIAGNOSIS — I132 Hypertensive heart and chronic kidney disease with heart failure and with stage 5 chronic kidney disease, or end stage renal disease: Secondary | ICD-10-CM | POA: Diagnosis not present

## 2017-03-07 DIAGNOSIS — J449 Chronic obstructive pulmonary disease, unspecified: Secondary | ICD-10-CM | POA: Diagnosis not present

## 2017-03-07 DIAGNOSIS — Z95828 Presence of other vascular implants and grafts: Secondary | ICD-10-CM

## 2017-03-07 DIAGNOSIS — I472 Ventricular tachycardia: Secondary | ICD-10-CM | POA: Diagnosis not present

## 2017-03-07 DIAGNOSIS — Z8249 Family history of ischemic heart disease and other diseases of the circulatory system: Secondary | ICD-10-CM | POA: Insufficient documentation

## 2017-03-07 DIAGNOSIS — D631 Anemia in chronic kidney disease: Secondary | ICD-10-CM | POA: Insufficient documentation

## 2017-03-07 DIAGNOSIS — Z8673 Personal history of transient ischemic attack (TIA), and cerebral infarction without residual deficits: Secondary | ICD-10-CM | POA: Insufficient documentation

## 2017-03-07 DIAGNOSIS — N185 Chronic kidney disease, stage 5: Secondary | ICD-10-CM | POA: Diagnosis not present

## 2017-03-07 DIAGNOSIS — G4733 Obstructive sleep apnea (adult) (pediatric): Secondary | ICD-10-CM | POA: Diagnosis not present

## 2017-03-07 DIAGNOSIS — Z992 Dependence on renal dialysis: Secondary | ICD-10-CM | POA: Diagnosis not present

## 2017-03-07 DIAGNOSIS — Z87891 Personal history of nicotine dependence: Secondary | ICD-10-CM | POA: Diagnosis not present

## 2017-03-07 DIAGNOSIS — E039 Hypothyroidism, unspecified: Secondary | ICD-10-CM | POA: Insufficient documentation

## 2017-03-07 DIAGNOSIS — I700 Atherosclerosis of aorta: Secondary | ICD-10-CM | POA: Insufficient documentation

## 2017-03-07 DIAGNOSIS — Z9884 Bariatric surgery status: Secondary | ICD-10-CM | POA: Insufficient documentation

## 2017-03-07 DIAGNOSIS — I5030 Unspecified diastolic (congestive) heart failure: Secondary | ICD-10-CM | POA: Diagnosis not present

## 2017-03-07 DIAGNOSIS — I739 Peripheral vascular disease, unspecified: Secondary | ICD-10-CM | POA: Diagnosis not present

## 2017-03-07 DIAGNOSIS — N186 End stage renal disease: Secondary | ICD-10-CM | POA: Diagnosis present

## 2017-03-07 DIAGNOSIS — K219 Gastro-esophageal reflux disease without esophagitis: Secondary | ICD-10-CM | POA: Insufficient documentation

## 2017-03-07 DIAGNOSIS — Z79899 Other long term (current) drug therapy: Secondary | ICD-10-CM | POA: Insufficient documentation

## 2017-03-07 DIAGNOSIS — Z8542 Personal history of malignant neoplasm of other parts of uterus: Secondary | ICD-10-CM | POA: Diagnosis not present

## 2017-03-07 DIAGNOSIS — Z823 Family history of stroke: Secondary | ICD-10-CM | POA: Diagnosis not present

## 2017-03-07 HISTORY — DX: Dyspnea, unspecified: R06.00

## 2017-03-07 HISTORY — PX: VENOGRAM: SHX5497

## 2017-03-07 HISTORY — DX: Sleep apnea, unspecified: G47.30

## 2017-03-07 HISTORY — PX: INSERTION OF DIALYSIS CATHETER: SHX1324

## 2017-03-07 HISTORY — PX: BASCILIC VEIN TRANSPOSITION: SHX5742

## 2017-03-07 HISTORY — DX: Cerebral infarction, unspecified: I63.9

## 2017-03-07 LAB — POCT I-STAT 4, (NA,K, GLUC, HGB,HCT)
Glucose, Bld: 58 mg/dL — ABNORMAL LOW (ref 65–99)
HCT: 28 % — ABNORMAL LOW (ref 36.0–46.0)
Hemoglobin: 9.5 g/dL — ABNORMAL LOW (ref 12.0–15.0)
Potassium: 5.4 mmol/L — ABNORMAL HIGH (ref 3.5–5.1)
Sodium: 141 mmol/L (ref 135–145)

## 2017-03-07 SURGERY — TRANSPOSITION, VEIN, BASILIC
Anesthesia: Choice | Site: Arm Upper | Laterality: Right

## 2017-03-07 MED ORDER — HEPARIN SODIUM (PORCINE) 1000 UNIT/ML IJ SOLN
INTRAMUSCULAR | Status: DC | PRN
Start: 2017-03-07 — End: 2017-03-07
  Administered 2017-03-07: 6000 [IU]

## 2017-03-07 MED ORDER — SODIUM CHLORIDE 0.9 % IV SOLN
INTRAVENOUS | Status: DC
  Administered 2017-03-07 (×4): via INTRAVENOUS

## 2017-03-07 MED ORDER — MEPERIDINE HCL 25 MG/ML IJ SOLN: 6.2500 mg | INTRAMUSCULAR | Status: DC | PRN

## 2017-03-07 MED ORDER — PROPOFOL 10 MG/ML IV BOLUS
INTRAVENOUS | Status: AC
  Filled 2017-03-07: qty 20

## 2017-03-07 MED ORDER — FENTANYL CITRATE (PF) 100 MCG/2ML IJ SOLN: 25.0000 ug | INTRAMUSCULAR | Status: DC | PRN

## 2017-03-07 MED ORDER — EPHEDRINE SULFATE 50 MG/ML IJ SOLN
INTRAMUSCULAR | Status: DC | PRN
  Administered 2017-03-07 (×2): 10 mg via INTRAVENOUS

## 2017-03-07 MED ORDER — PROTAMINE SULFATE 10 MG/ML IV SOLN
INTRAVENOUS | Status: AC
  Filled 2017-03-07: qty 5

## 2017-03-07 MED ORDER — CHLORHEXIDINE GLUCONATE 4 % EX LIQD: 60.0000 mL | Freq: Once | CUTANEOUS | Status: DC

## 2017-03-07 MED ORDER — ONDANSETRON HCL 4 MG/2ML IJ SOLN
INTRAMUSCULAR | Status: DC | PRN
  Administered 2017-03-07: 4 mg via INTRAVENOUS

## 2017-03-07 MED ORDER — LIDOCAINE 2% (20 MG/ML) 5 ML SYRINGE
INTRAMUSCULAR | Status: DC | PRN
  Administered 2017-03-07: 40 mg via INTRAVENOUS

## 2017-03-07 MED ORDER — 0.9 % SODIUM CHLORIDE (POUR BTL) OPTIME
TOPICAL | Status: DC | PRN
  Administered 2017-03-07: 1000 mL

## 2017-03-07 MED ORDER — HEPARIN SODIUM (PORCINE) 1000 UNIT/ML IJ SOLN
INTRAMUSCULAR | Status: AC
  Filled 2017-03-07: qty 1

## 2017-03-07 MED ORDER — IODIXANOL 320 MG/ML IV SOLN
INTRAVENOUS | Status: DC | PRN
  Administered 2017-03-07: 83 mL via INTRAVENOUS

## 2017-03-07 MED ORDER — PROTAMINE SULFATE 10 MG/ML IV SOLN
INTRAVENOUS | Status: DC | PRN
  Administered 2017-03-07: 20 mg via INTRAVENOUS
  Administered 2017-03-07: 10 mg via INTRAVENOUS

## 2017-03-07 MED ORDER — DEXTROSE 5 % IV SOLN
1.5000 g | INTRAVENOUS | Status: AC
  Administered 2017-03-07: 1.5 g via INTRAVENOUS
  Filled 2017-03-07: qty 1.5

## 2017-03-07 MED ORDER — MIDAZOLAM HCL 2 MG/2ML IJ SOLN
INTRAMUSCULAR | Status: AC
  Filled 2017-03-07: qty 2

## 2017-03-07 MED ORDER — FENTANYL CITRATE (PF) 250 MCG/5ML IJ SOLN
INTRAMUSCULAR | Status: DC | PRN
  Administered 2017-03-07: 50 ug via INTRAVENOUS
  Administered 2017-03-07: 25 ug via INTRAVENOUS
  Administered 2017-03-07 (×3): 50 ug via INTRAVENOUS
  Administered 2017-03-07 (×2): 25 ug via INTRAVENOUS
  Administered 2017-03-07: 50 ug via INTRAVENOUS
  Administered 2017-03-07: 25 ug via INTRAVENOUS
  Administered 2017-03-07: 50 ug via INTRAVENOUS

## 2017-03-07 MED ORDER — PHENYLEPHRINE HCL 10 MG/ML IJ SOLN
INTRAVENOUS | Status: DC | PRN
  Administered 2017-03-07: 25 ug/min via INTRAVENOUS

## 2017-03-07 MED ORDER — HEPARIN SODIUM (PORCINE) 5000 UNIT/ML IJ SOLN
INTRAMUSCULAR | Status: DC | PRN
  Administered 2017-03-07: 500 mL

## 2017-03-07 MED ORDER — MIDAZOLAM HCL 2 MG/2ML IJ SOLN
INTRAMUSCULAR | Status: DC | PRN
  Administered 2017-03-07 (×2): 1 mg via INTRAVENOUS

## 2017-03-07 MED ORDER — LIDOCAINE HCL (PF) 1 % IJ SOLN
INTRAMUSCULAR | Status: AC
  Filled 2017-03-07: qty 30

## 2017-03-07 MED ORDER — LIDOCAINE HCL (PF) 1 % IJ SOLN
INTRAMUSCULAR | Status: DC | PRN
  Administered 2017-03-07: 30 mL

## 2017-03-07 MED ORDER — FENTANYL CITRATE (PF) 250 MCG/5ML IJ SOLN
INTRAMUSCULAR | Status: AC
  Filled 2017-03-07: qty 5

## 2017-03-07 MED ORDER — PROPOFOL 500 MG/50ML IV EMUL
INTRAVENOUS | Status: DC | PRN
  Administered 2017-03-07: 50 ug/kg/min via INTRAVENOUS

## 2017-03-07 MED ORDER — HEPARIN SODIUM (PORCINE) 1000 UNIT/ML IJ SOLN
INTRAMUSCULAR | Status: DC | PRN
  Administered 2017-03-07: 3000 [IU] via INTRAVENOUS

## 2017-03-07 MED ORDER — MIDAZOLAM HCL 2 MG/2ML IJ SOLN: 0.5000 mg | Freq: Once | INTRAMUSCULAR | Status: DC | PRN

## 2017-03-07 MED ORDER — PROPOFOL 10 MG/ML IV BOLUS
INTRAVENOUS | Status: DC | PRN
  Administered 2017-03-07: 80 mg via INTRAVENOUS

## 2017-03-07 SURGICAL SUPPLY — 98 items
ARMBAND PINK RESTRICT EXTREMIT (MISCELLANEOUS) ×4 IMPLANT
BAG BANDED W/RUBBER/TAPE 36X54 (MISCELLANEOUS) ×4 IMPLANT
BAG DECANTER FOR FLEXI CONT (MISCELLANEOUS) ×4 IMPLANT
BAG SNAP BAND KOVER 36X36 (MISCELLANEOUS) ×4 IMPLANT
BANDAGE ACE 4X5 VEL STRL LF (GAUZE/BANDAGES/DRESSINGS) IMPLANT
BANDAGE ESMARK 6X9 LF (GAUZE/BANDAGES/DRESSINGS) IMPLANT
BIOPATCH RED 1 DISK 7.0 (GAUZE/BANDAGES/DRESSINGS) ×4 IMPLANT
BLADE SURG 11 STRL SS (BLADE) ×4 IMPLANT
BNDG ESMARK 6X9 LF (GAUZE/BANDAGES/DRESSINGS)
CANISTER SUCT 3000ML PPV (MISCELLANEOUS) ×4 IMPLANT
CANNULA VESSEL 3MM 2 BLNT TIP (CANNULA) ×8 IMPLANT
CATH ANGIO 5F BER2 65CM (CATHETERS) ×4 IMPLANT
CATH PALINDROME RT-P 15FX19CM (CATHETERS) IMPLANT
CATH PALINDROME RT-P 15FX23CM (CATHETERS) IMPLANT
CATH PALINDROME RT-P 15FX28CM (CATHETERS) IMPLANT
CATH PALINDROME RT-P 15FX55CM (CATHETERS) ×4 IMPLANT
CATH STRAIGHT 5FR 65CM (CATHETERS) IMPLANT
CHLORAPREP W/TINT 26ML (MISCELLANEOUS) ×4 IMPLANT
CLIP VESOCCLUDE MED 24/CT (CLIP) ×4 IMPLANT
CLIP VESOCCLUDE SM WIDE 24/CT (CLIP) ×4 IMPLANT
CONT SPEC 4OZ CLIKSEAL STRL BL (MISCELLANEOUS) ×8 IMPLANT
COVER DOME SNAP 22 D (MISCELLANEOUS) ×4 IMPLANT
COVER PROBE W GEL 5X96 (DRAPES) ×4 IMPLANT
COVER SURGICAL LIGHT HANDLE (MISCELLANEOUS) IMPLANT
CUFF TOURNIQUET SINGLE 24IN (TOURNIQUET CUFF) IMPLANT
CUFF TOURNIQUET SINGLE 34IN LL (TOURNIQUET CUFF) IMPLANT
CUFF TOURNIQUET SINGLE 44IN (TOURNIQUET CUFF) IMPLANT
DECANTER SPIKE VIAL GLASS SM (MISCELLANEOUS) ×4 IMPLANT
DERMABOND ADVANCED (GAUZE/BANDAGES/DRESSINGS) ×2
DERMABOND ADVANCED .7 DNX12 (GAUZE/BANDAGES/DRESSINGS) ×6 IMPLANT
DEVICE TORQUE KENDALL .025-038 (MISCELLANEOUS) ×4 IMPLANT
DRAIN CHANNEL 15F RND FF W/TCR (WOUND CARE) IMPLANT
DRAIN PENROSE 1/4X12 LTX STRL (WOUND CARE) IMPLANT
DRAPE C-ARM 42X72 X-RAY (DRAPES) IMPLANT
DRAPE CHEST BREAST 15X10 FENES (DRAPES) ×4 IMPLANT
DRAPE HALF SHEET 40X57 (DRAPES) IMPLANT
DRSG COVADERM 4X6 (GAUZE/BANDAGES/DRESSINGS) ×4 IMPLANT
ELECT REM PT RETURN 9FT ADLT (ELECTROSURGICAL) ×4
ELECTRODE REM PT RTRN 9FT ADLT (ELECTROSURGICAL) ×3 IMPLANT
EVACUATOR SILICONE 100CC (DRAIN) IMPLANT
GAUZE SPONGE 4X4 16PLY XRAY LF (GAUZE/BANDAGES/DRESSINGS) ×8 IMPLANT
GLOVE BIO SURGEON STRL SZ7.5 (GLOVE) ×4 IMPLANT
GOWN STRL REUS W/ TWL LRG LVL3 (GOWN DISPOSABLE) ×9 IMPLANT
GOWN STRL REUS W/TWL LRG LVL3 (GOWN DISPOSABLE) ×3
GUIDEWIRE ANGLED .035X150CM (WIRE) ×4 IMPLANT
HEMOSTAT SPONGE AVITENE ULTRA (HEMOSTASIS) IMPLANT
KIT BASIN OR (CUSTOM PROCEDURE TRAY) ×4 IMPLANT
KIT ROOM TURNOVER OR (KITS) ×4 IMPLANT
LOOP VESSEL MINI RED (MISCELLANEOUS) IMPLANT
MARKER GRAFT CORONARY BYPASS (MISCELLANEOUS) IMPLANT
NEEDLE 18GX1X1/2 (RX/OR ONLY) (NEEDLE) ×4 IMPLANT
NEEDLE HYPO 25GX1X1/2 BEV (NEEDLE) ×4 IMPLANT
NS IRRIG 1000ML POUR BTL (IV SOLUTION) ×4 IMPLANT
PACK CV ACCESS (CUSTOM PROCEDURE TRAY) ×4 IMPLANT
PACK GENERAL/GYN (CUSTOM PROCEDURE TRAY) IMPLANT
PACK PERIPHERAL VASCULAR (CUSTOM PROCEDURE TRAY) IMPLANT
PACK SURGICAL SETUP 50X90 (CUSTOM PROCEDURE TRAY) IMPLANT
PACK UNIVERSAL I (CUSTOM PROCEDURE TRAY) IMPLANT
PAD ARMBOARD 7.5X6 YLW CONV (MISCELLANEOUS) ×8 IMPLANT
PROTECTION STATION PRESSURIZED (MISCELLANEOUS) ×4
SET MICROPUNCTURE 5F STIFF (MISCELLANEOUS) IMPLANT
SHEATH AVANTI 11CM 5FR (MISCELLANEOUS) ×4 IMPLANT
SPONGE SURGIFOAM ABS GEL 100 (HEMOSTASIS) IMPLANT
STAPLER VISISTAT 35W (STAPLE) IMPLANT
STATION PROTECTION PRESSURIZED (MISCELLANEOUS) ×3 IMPLANT
STOPCOCK 3 WAY HIGH PRESSURE (MISCELLANEOUS) ×1
STOPCOCK 3WAY HIGH PRESSURE (MISCELLANEOUS) ×3 IMPLANT
STOPCOCK 4 WAY LG BORE MALE ST (IV SETS) ×4 IMPLANT
STOPCOCK MORSE 400PSI 3WAY (MISCELLANEOUS) IMPLANT
SUT ETHILON 3 0 PS 1 (SUTURE) IMPLANT
SUT PROLENE 5 0 C 1 24 (SUTURE) IMPLANT
SUT PROLENE 6 0 CC (SUTURE) IMPLANT
SUT PROLENE 7 0 BV 1 (SUTURE) ×8 IMPLANT
SUT PROLENE BLUE 7 0 (SUTURE) ×4 IMPLANT
SUT SILK 2 0 PERMA HAND 18 BK (SUTURE) ×4 IMPLANT
SUT SILK 2 0 SH (SUTURE) IMPLANT
SUT SILK 3 0 (SUTURE) ×1
SUT SILK 3-0 18XBRD TIE 12 (SUTURE) ×3 IMPLANT
SUT VIC AB 2-0 SH 27 (SUTURE)
SUT VIC AB 2-0 SH 27XBRD (SUTURE) IMPLANT
SUT VIC AB 3-0 SH 27 (SUTURE) ×3
SUT VIC AB 3-0 SH 27X BRD (SUTURE) ×9 IMPLANT
SUT VIC AB 4-0 PS2 27 (SUTURE) ×20 IMPLANT
SUT VICRYL 4-0 PS2 18IN ABS (SUTURE) ×8 IMPLANT
SYR 10ML LL (SYRINGE) IMPLANT
SYR 20CC LL (SYRINGE) ×16 IMPLANT
SYR 30ML LL (SYRINGE) ×4 IMPLANT
SYR 5ML LL (SYRINGE) IMPLANT
SYR CONTROL 10ML LL (SYRINGE) IMPLANT
TOWEL GREEN STERILE (TOWEL DISPOSABLE) ×8 IMPLANT
TOWEL GREEN STERILE FF (TOWEL DISPOSABLE) ×4 IMPLANT
TRAY FOLEY W/METER SILVER 16FR (SET/KITS/TRAYS/PACK) IMPLANT
TUBING CIL FLEX 10 FLL-RA (TUBING) ×4 IMPLANT
TUBING EXTENTION W/L.L. (IV SETS) IMPLANT
UNDERPAD 30X30 (UNDERPADS AND DIAPERS) ×4 IMPLANT
WATER STERILE IRR 1000ML POUR (IV SOLUTION) ×4 IMPLANT
WIRE AMPLATZ SS-J .035X180CM (WIRE) IMPLANT
WIRE ROSEN-J .035X260CM (WIRE) ×4 IMPLANT

## 2017-03-07 NOTE — Telephone Encounter (Signed)
Sched appt 04/19/17; lab at 3:00 and MD at 3:45. Spoke to pt to inform them of appt.

## 2017-03-07 NOTE — Progress Notes (Signed)
Patient has a stage 1 pressure sore/ Deep Tissue Injury to the sacrum area.  Intervention with Sacral Dressing

## 2017-03-07 NOTE — Anesthesia Preprocedure Evaluation (Addendum)
Anesthesia Evaluation  Patient identified by MRN, date of birth, ID band Patient awake    Reviewed: Allergy & Precautions, NPO status , Patient's Chart, lab work & pertinent test results  History of Anesthesia Complications Negative for: history of anesthetic complications  Airway Mallampati: I  TM Distance: >3 FB Neck ROM: Full    Dental  (+) Poor Dentition, Missing, Chipped, Dental Advisory Given   Pulmonary sleep apnea , COPD, former smoker,    breath sounds clear to auscultation       Cardiovascular hypertension, Pt. on medications (-) angina+ Peripheral Vascular Disease  + dysrhythmias Atrial Fibrillation and Supra Ventricular Tachycardia  Rhythm:Regular Rate:Normal     Neuro/Psych negative neurological ROS     GI/Hepatic negative GI ROS, Neg liver ROS, GERD  Medicated and Controlled,H/o gastric bypass   Endo/Other  Hypothyroidism   Renal/GU Dialysis and ESRFRenal disease (MWF, K +5.4)Last dialysis Friday     Musculoskeletal   Abdominal   Peds  Hematology  (+) Blood dyscrasia (Hb 9.5), anemia ,   Anesthesia Other Findings   Reproductive/Obstetrics                            Anesthesia Physical Anesthesia Plan  ASA: III  Anesthesia Plan: MAC   Post-op Pain Management:    Induction:   PONV Risk Score and Plan: 2 and Ondansetron and Dexamethasone  Airway Management Planned: Natural Airway and Nasal Cannula  Additional Equipment:   Intra-op Plan:   Post-operative Plan:   Informed Consent: I have reviewed the patients History and Physical, chart, labs and discussed the procedure including the risks, benefits and alternatives for the proposed anesthesia with the patient or authorized representative who has indicated his/her understanding and acceptance.   Dental advisory given and Consent reviewed with POA  Plan Discussed with: CRNA and Surgeon  Anesthesia Plan Comments:  (Plan routine monitors, MAC )        Anesthesia Quick Evaluation

## 2017-03-07 NOTE — Telephone Encounter (Signed)
-----   Message from Mena Goes, RN sent at 03/07/2017  2:31 PM EST ----- Regarding: 4-6 weeks w/ duplex   ----- Message ----- From: Gabriel Earing, PA-C Sent: 03/07/2017  12:37 PM To: Vvs Charge Pool  S/p right BVT and TDC.  F/u with Dr. Oneida Alar in 4-6 weeks with duplex.  Thanks

## 2017-03-07 NOTE — Interval H&P Note (Signed)
History and Physical Interval Note:  03/07/2017 9:43 AM  Robin Hampton  has presented today for surgery, with the diagnosis of end stage renal disease  The various methods of treatment have been discussed with the patient and family. After consideration of risks, benefits and other options for treatment, the patient has consented to  Procedure(s): BASILIC VEIN TRANSPOSITION (Right) INSERTION OF DIALYSIS CATHETER, FEMORAL ARTERY (N/A) CENTRAL VENOGRAM (N/A) as a surgical intervention .  The patient's history has been reviewed, patient examined, no change in status, stable for surgery.  I have reviewed the patient's chart and labs.  Questions were answered to the patient's satisfaction.     Ruta Hinds

## 2017-03-07 NOTE — Op Note (Addendum)
Procedure: #1 left central venogram  #2 right central venogram  #3 right basilic vein transposition fistula  #4 removal of right internal jugular vein palindrome catheter  #5 insertion of right femoral palindrome catheter  Preoperative diagnosis: End-stage renal disease  Postoperative diagnosis: Same  Anesthesia: General  Assistant: Leontine Locket PA-C  Operative findings: #1 widely patent left central and right central venous system  #2 3 mm basilic vein number, very thin-walled #3 55 cm palindrome catheter right femoral vein  Procedure: After obtaining informed consent, the patient was taken to the operating room.  The patient was placed in supine position operating table.  Next a left forearm IV was accessed and a contrast injection was performed under fluoroscopy to evaluate the left central venous system.  The left axillary subclavian and innominate vein and superior vena cava is patent.  At this point attention was turned to the right upper extremity.  The patient's entire right upper extremity was prepped and draped in usual sterile fashion.  Ultrasound was used to identify the right basilic vein near the antecubital fossa.  Local anesthesia was infiltrated over this area and the incision carried down through the subtendinous tissues down the level of the basilic vein.  It was about 3-4 mm in diameter.  The patient was becoming agitated and intolerant of the operation at this point a general anesthetic was commenced.  I mobilized about 4 cm of the basilic vein and then transected this and ligated it distally.  An 18-gauge Jelco was then placed into the vein and central venogram was performed on the right side.  The right axillary subclavian veins were patent.  The innominate vein was patent.  The superior vena cava was patent.  At this point I proceeded to harvest the basilic vein through several incisions in the right arm.  The vein was 3-4 mm throughout most of its course.  Small side  branches were ligated and divided by silk ties.  The nerves sensory and motor were protected.  The brachial artery was exposed by deepening the antecubital incision.  The artery was fairly thickened on palpation.  I did have a pulse within it.  It was about 2-1/2 mm in diameter.  This was dissected free circumferentially and a vessel loop placed around it.  A subcutaneous tunnel was then created connecting the antecubital incision to the axillary incision.  The vein was marked for orientation and then brought through the subcutaneous tunnel.  The vein had previously been distended with heparinized saline to assure that there was good hemostasis and the tic vein was of good quality diameter.  The vein was very thin-walled.  The patient was then given 3000 units of intravenous heparin.  Small bulldog clamps were used to control the artery proximally distally.  11 blade was used to make a small arteriotomy and the vein was then sewn end of vein to side of artery using running 7-0 Prolene suture.  Just prior to completion of the anastomosis it was forebled backbled and thoroughly flushed.  The anastomosis was secured clamps released there is palpable thrill in fistula medially.  There was good distention of the vein all the way up to the axilla.  Patient had an audible biphasic radial Doppler signal was augmented about 50% with compression of the fistula.  Next hemostasis was obtained.  The patient was given 30 mm protamine.  All the incisions were closed with multiple running 3-0 Vicryl followed by 4-0 Vicryl subcuticular stitch.  Dermabond was applied all  the skin incisions.    Attention was then turned to the right internal jugular vein catheter.  This was removed with gentle traction.  Hemostasis was obtained with direct pressure for approximately 5 minutes.  Finally, attention was turned to the patient's groin.  Both groins were prepped and draped in usual sterile fashion.  Ultrasound was used to identify the  right common femoral vein.  An introducer needle was used to cannulate this and an 035 J-wire advanced up into the inferior vena cava under fluoroscopic guidance.  A J-wire was preferentially going out into the hepatic veins.  Therefore the wire was pulled back slightly and a 5 French sheath placed over the guidewire in the right femoral vein.  A 5 [place] Berenstein catheter was then used to direct the wire.  I still could not get the J-wire to go into the right atrium.  Therefore the J-wire was removed and exchanged for an 035 angled Glidewire.  This was advanced easily up into the right atrium and followed by the superior vena cava.  The Berenstein catheter was then advanced over this into the right atrium.  The Glidewire was removed and exchanged for an 035 Rosen wire.  Sequential 1214 and 16 French dilators with a pillow sheath placed over the guidewire in the right femoral vein.  A 55 cm palindrome catheter was then advanced up into the right atrium under fluoroscopic guidance.  The peel-away sheath was removed.  The catheter was tunneled subcutaneously cut to length and the hub attached.  There was noted to flush and draw easily.  Catheter tip was mid right atrium.  Catheter was sutured in the place with nylon sutures and the insertion site closed with Vicryl stitch.  Dermabond was applied to this.  The patient tolerated the procedure well and there were no complications.  The instrument sponge needle count was correct at the end of case.  The patient was taken to the recovery room in stable condition.  Ruta Hinds, MD [facility] [place] Office: [phone] Pager: [phone]

## 2017-03-07 NOTE — Anesthesia Postprocedure Evaluation (Signed)
Anesthesia Post Note  Patient: Robin Hampton  Procedure(s) Performed: BASILIC VEIN TRANSPOSITION (Right Arm Upper) INSERTION OF DIALYSIS CATHETER, FEMORAL ARTERY (N/A ) CENTRAL VENOGRAM BILATERAL  (Bilateral Arm Lower)     Patient location during evaluation: PACU Anesthesia Type: General Level of consciousness: awake and alert, patient cooperative and oriented Pain management: pain level controlled Vital Signs Assessment: post-procedure vital signs reviewed and stable Respiratory status: spontaneous breathing, nonlabored ventilation and respiratory function stable Cardiovascular status: blood pressure returned to baseline and stable Postop Assessment: no apparent nausea or vomiting Anesthetic complications: no    Last Vitals:  Vitals:   03/07/17 1432 03/07/17 1500  BP: (!) 114/55 132/76  Pulse:    Resp: 12 16  Temp: 36.7 C   SpO2: 94% 99%    Last Pain:  Vitals:   03/07/17 1500  TempSrc:   PainSc: 0-No pain                 Illyanna Petillo,E. Leesa Leifheit

## 2017-03-07 NOTE — Discharge Instructions (Signed)
° °  [facility]  Discharge Instructions  AV Fistula or Graft Surgery for Dialysis Access  Please refer to the following instructions for your post-procedure care. Your surgeon or physician assistant will discuss any changes with you.  Activity  You may drive the day following your surgery, if you are comfortable and no longer taking prescription pain medication. Resume full activity as the soreness in your incision resolves.  Bathing/Showering  You may shower after you go home. Keep your incision dry for 48 hours. Do not soak in a bathtub, hot tub, or swim until the incision heals completely. You may not shower if you have a hemodialysis catheter.  Incision Care  Clean your incision with mild soap and water after 48 hours. Pat the area dry with a clean towel. You do not need a bandage unless otherwise instructed. Do not apply any ointments or creams to your incision. You may have skin glue on your incision. Do not peel it off. It will come off on its own in about one week. Your arm may swell a bit after surgery. To reduce swelling use pillows to elevate your arm so it is above your heart. Your doctor will tell you if you need to lightly wrap your arm with an ACE bandage.  Diet  Resume your normal diet. There are not special food restrictions following this procedure. In order to heal from your surgery, it is CRITICAL to get adequate nutrition. Your body requires vitamins, minerals, and protein. Vegetables are the best source of vitamins and minerals. Vegetables also provide the perfect balance of protein. Processed food has little nutritional value, so try to avoid this.  Medications  Resume taking all of your medications. If your incision is causing pain, you may take over-the counter pain relievers such as acetaminophen (Tylenol). If you were prescribed a stronger pain medication, please be aware these medications can cause nausea and constipation. Prevent  nausea by taking the medication with a snack or meal. Avoid constipation by drinking plenty of fluids and eating foods with high amount of fiber, such as fruits, vegetables, and grains. Do not take Tylenol if you are taking prescription pain medications.  Follow up Your surgeon may want to see you in the office following your access surgery. If so, this will be arranged at the time of your surgery.  Please call us immediately for any of the following conditions:  Increased pain, redness, drainage (pus) from your incision site Fever of 101 degrees or higher Severe or worsening pain at your incision site Hand pain or numbness.  Reduce your risk of vascular disease:  Stop smoking. If you would like help, call [facility] at 1-800-QUIT-NOW 870 289 4603) or [place] at [place] your cholesterol Maintain a desired weight Control your diabetes Keep your blood pressure down  Dialysis  It will take several weeks to several months for your new dialysis access to be ready for use. Your surgeon will determine when it is OK to use it. Your nephrologist will continue to direct your dialysis. You can continue to use your Permcath until your new access is ready for use.   03/07/2017 Robin Hampton [id number] 06-03-1945  Surgeon(s): Fields, Jessy Oto, MD  Procedure(s): BASILIC VEIN TRANSPOSITION-RIGHT INSERTION OF DIALYSIS CATHETER, FEMORAL ARTERY CENTRAL VENOGRAM  x Do not stick fistula for 12 weeks    If you have any questions, please call the office at [phone].

## 2017-03-07 NOTE — Anesthesia Procedure Notes (Signed)
Procedure Name: LMA Insertion Date/Time: 03/07/2017 10:25 AM Performed by: Clearnce Sorrel, CRNA Pre-anesthesia Checklist: Patient identified, Emergency Drugs available, Suction available, Patient being monitored and Timeout performed Patient Re-evaluated:Patient Re-evaluated prior to induction Oxygen Delivery Method: Circle system utilized Preoxygenation: Pre-oxygenation with 100% oxygen Induction Type: IV induction LMA: LMA inserted LMA Size: 3.0 Placement Confirmation: positive ETCO2 and breath sounds checked- equal and bilateral Tube secured with: Tape Dental Injury: Teeth and Oropharynx as per pre-operative assessment

## 2017-03-07 NOTE — Transfer of Care (Signed)
Immediate Anesthesia Transfer of Care Note  Patient: Robin Hampton  Procedure(s) Performed: BASILIC VEIN TRANSPOSITION (Right Arm Upper) INSERTION OF DIALYSIS CATHETER, FEMORAL ARTERY (N/A ) CENTRAL VENOGRAM BILATERAL  (Bilateral Arm Lower)  Patient Location: PACU  Anesthesia Type:General  Level of Consciousness: awake  Airway & Oxygen Therapy: Patient Spontanous Breathing and Patient connected to face mask oxygen  Post-op Assessment: Report given to RN and Post -op Vital signs reviewed and stable  Post vital signs: Reviewed and stable  Last Vitals:  Vitals:   03/07/17 0808  BP: (!) 121/52  Pulse: 67  Resp: 16  Temp: 36.5 C  SpO2: 98%    Last Pain:  Vitals:   03/07/17 0808  TempSrc: Oral  PainSc:       Patients Stated Pain Goal: 4 (18/84/16 6063)  Complications: No apparent anesthesia complications

## 2017-03-08 ENCOUNTER — Encounter (HOSPITAL_COMMUNITY): Payer: Self-pay | Admitting: Vascular Surgery

## 2017-03-08 ENCOUNTER — Other Ambulatory Visit: Payer: Self-pay

## 2017-03-08 DIAGNOSIS — N185 Chronic kidney disease, stage 5: Secondary | ICD-10-CM

## 2017-03-08 DIAGNOSIS — Z48812 Encounter for surgical aftercare following surgery on the circulatory system: Secondary | ICD-10-CM

## 2017-03-09 ENCOUNTER — Encounter: Payer: Self-pay | Admitting: Oncology

## 2017-03-09 ENCOUNTER — Encounter: Payer: Self-pay | Admitting: Internal Medicine

## 2017-03-12 NOTE — Telephone Encounter (Signed)
Please see Mychart message from patient's daughter.

## 2017-03-13 ENCOUNTER — Telehealth: Payer: Self-pay | Admitting: Oncology

## 2017-03-13 ENCOUNTER — Other Ambulatory Visit: Payer: Medicare Other

## 2017-03-13 ENCOUNTER — Other Ambulatory Visit: Payer: Self-pay | Admitting: Oncology

## 2017-03-13 DIAGNOSIS — D631 Anemia in chronic kidney disease: Secondary | ICD-10-CM

## 2017-03-13 DIAGNOSIS — Z992 Dependence on renal dialysis: Principal | ICD-10-CM

## 2017-03-13 DIAGNOSIS — N186 End stage renal disease: Principal | ICD-10-CM

## 2017-03-13 MED ORDER — CYANOCOBALAMIN 1000 MCG/ML IJ SOLN: 1000.0000 ug | INTRAMUSCULAR | 6 refills | Status: AC

## 2017-03-13 NOTE — Telephone Encounter (Signed)
Scheduled injections per 2/5 sch msg - moved to Tuesday due to patient being on dialysis

## 2017-03-14 ENCOUNTER — Encounter: Payer: Self-pay | Admitting: Internal Medicine

## 2017-03-15 ENCOUNTER — Telehealth: Payer: Self-pay | Admitting: *Deleted

## 2017-03-15 ENCOUNTER — Telehealth: Payer: Self-pay | Admitting: Vascular Surgery

## 2017-03-15 NOTE — Telephone Encounter (Signed)
Recommend she speaks with nephrology regarding PE - dialysis pts have different treatment pathway than non dialysis pts

## 2017-03-15 NOTE — Telephone Encounter (Signed)
Ok - I will be the attending

## 2017-03-15 NOTE — Telephone Encounter (Signed)
Patient's daughter Robin Hampton called today requesting information about patient's recent CT scan of the chest.  The daughter was very upset after being told by the nurse manager at for sending [place] [facility] dialysis center that her mother had a pulmonary embolus and that was the cause of her shortness of breath and that it should have been treated and there were other findings on the chest CT that should have been discussed with the patient's daughter as well.  This conversation was held without the prior knowledge of myself or Dr. Posey Pronto.  I discussed at length with the patient's daughter today the findings of the CT scan as well as our reasons for proceeding with her dialysis access.  I discussed with her that without her dialysis catheter she would not be able to have dialysis and we felt this was a low risk procedure and could proceed.  The patient underwent bilateral central venogram placement of a femoral dialysis catheter removal of a right internal jugular vein dialysis catheter and placement of a right arm AV fistula on Wednesday, March 07, 2017.  Prior to the patient's operation she had a poorly functioning right internal jugular vein dialysis catheter that had been placed by nephrology.  Her nephrologist, Dr. Posey Pronto, was concerned that she had extrinsic compression of the catheter that may have been impeding flow.  He ordered a CT scan of the chest.  This was resulted the day of her operation.  It was documented that Dr. Earle Gell from radiology communicated these results with Dr. Posey Pronto.   I spoke with Dr. Posey Pronto that morning after he had spoken with radiology about the patient's chest CT.  But he felt that we could proceed with her procedure and that there was no extrinsic mass obstructing the catheter or compressing her central veins.  Additional findings on the CT scan of the chest showed a nonocclusive pulmonary embolism in the proximal right lower lobe "subacute" in age.  It also showed  pulmonary hypertension as well as mildly enlarged mediastinal lymph nodes and a 4.2 cm ascending thoracic aortic aneurysm.  On the day of the patient's operation.  She exhibited no symptoms of shortness of breath chest pain or hemoptysis.  He had an uneventful postoperative recovery with normal oxygen saturations respirations and clinical exam at 6 PM the day of her operation.  According to the patient's daughter she subsequently went to hemodialysis on Thursday morning the day following her operation.  At that time she apparently was on dialysis from 1050 in the morning until 6 PM that evening.  After dialysis her mother describes a smothering sensation.  However, her mother did not want to go to the emergency room for further evaluation.  The daughter gave her mother half a Xanax and her symptoms seem to improve.  However, her blood pressure was low after dialysis.  The daughter then spoke with someone from [facility] named Mericel on Friday and stated that her mother did not want to come because she did not feel like going to dialysis and felt poorly overall.  Apparently some recommendations were given regarding diet and fluid intake.  The patient returned for hemodialysis on Monday and again describes a smothering sensation and had low blood pressure but no further evaluation was performed.  The patient had decided after that dialysis session that she did not want any further dialysis.  The patient's daughter then spoke with the nurse manager at [facility] for sending [place] on Wednesday at which time the  nurse manager stated to the patient's daughter that the patient had had a pulmonary embolus on her CT scan dated January 30 and that was the cause of her shortness of breath symptoms and that she should have been treated at that time and she needed to contact her nephrologist.  Nurse manager also apparently told the daughter that she should have been informed of all of the CT scan findings including the enlarged  lymph nodes and the ascending aortic aneurysm.  At no point did the [facility] dialysis center refer the patient for further evaluation for workup of the patient's symptoms of hypotension or shortness of breath or her symptoms of "smothering sensation".  I discussed with the patient's daughter that certainly a pulmonary embolus can cause these symptoms.  However, further workup could reveal other sources such as myocardial infarction or pneumonia and that usually a complaint of shortness of breath should be further evaluated such as the patient experienced on her initial presentation on that Friday.  The patient remained short of breath and after this point on Thursday decided she did not want any further medical treatment and has now opted for hospice care.    The patient's daughter contacted our office today wishing to have further explanation regarding the CT scan.  According to the daughter she has tried to make numerous attempts to communicate with nephrology on several days since the patient began having problems but has not had a return phone call from a nephrologist.  I spoke with Dr. Posey Pronto by phone today and relayed the above conversation to him.  He is going to call the patient's daughter today.  I told Ms. Williford that if she has any further questions to feel free to contact us in the future and expressed my sympathy toward her mother's current condition since she has decided to pursue hospice care.  Ruta Hinds, MD [facility] [place] Office: [phone] Pager: [phone]

## 2017-03-15 NOTE — Telephone Encounter (Signed)
Patient daughter, Lenna Sciara called and stated that patient had a CT of Chest done on 03/06/17 and showed Pulmonary Embolism. Daughter wants you to review the report and give your Opinion/Feedback. Very concerned. Stated that the Nephrologist and CardioVasc Surgeon has not really done anything since they found it. Daughter would like your thoughts on it. Daughter stated that she has also sent you a MyChart message to. Please Advise.

## 2017-03-15 NOTE — Telephone Encounter (Signed)
Robin Hampton with [facility] called and stated that daughter called requesting Hospice to come out to evaluate patient. Wants you to be the attending. Please Advise.   I spoke with daughter, Daughter also stated that patient does not want to continue with Dialysis. Daughter stated that patient has lost confidence in Dr.'s except for you.

## 2017-03-15 NOTE — Telephone Encounter (Signed)
Responded to my chart question by a telephone call to daughter Lenna Sciara. Per Dr. Alen Blew, 1.) SOB and confusion is not related to a Hemoglobin of 9.5, 2.) B12 injections have been ordered to her pharmacy, 3.) Aranesp injections have been ordered to be given at the [facility] IF she is not getting them with Hemodialysis. Informed Melissa that if she is getting Aranesp with hemodialysis, she does NOT need to get them at the [facility]. Instructed her to call [place] to review this message.

## 2017-03-15 NOTE — Telephone Encounter (Signed)
Amy with Hospice notified and agreed.

## 2017-03-15 NOTE — Telephone Encounter (Signed)
Patient daughter notified and agreed.  

## 2017-03-16 ENCOUNTER — Ambulatory Visit: Payer: Medicare Other

## 2017-03-16 ENCOUNTER — Other Ambulatory Visit: Payer: Medicare Other

## 2017-03-17 ENCOUNTER — Encounter: Payer: Self-pay | Admitting: Internal Medicine

## 2017-03-19 ENCOUNTER — Telehealth: Payer: Self-pay | Admitting: *Deleted

## 2017-03-19 MED ORDER — OXYCODONE HCL 5 MG PO TABS: 5.0000 mg | ORAL_TABLET | Freq: Three times a day (TID) | ORAL | 0 refills | Status: DC | PRN

## 2017-03-19 NOTE — Telephone Encounter (Signed)
Patient daughter notified. LM on her voicemail with directions and to give [place] a call back if worsens or doesn't get better.

## 2017-03-19 NOTE — Telephone Encounter (Signed)
imodium is OTC and for diarrhea

## 2017-03-19 NOTE — Telephone Encounter (Signed)
Patient daughter, Lenna Sciara called and stated that since patient stopped her dialysis she has had terrible diarrhea. Has been going on 4 days now after she eats. Daughter is wondering if there is anything OTC patient can take for this that won't hurt her kidneys. Please Advise.

## 2017-03-20 ENCOUNTER — Inpatient Hospital Stay: Attending: Oncology

## 2017-03-20 ENCOUNTER — Inpatient Hospital Stay

## 2017-03-20 ENCOUNTER — Encounter: Payer: Self-pay | Admitting: *Deleted

## 2017-03-20 VITALS — BP 122/66 | HR 80 | Temp 98.1°F | Resp 16

## 2017-03-20 DIAGNOSIS — Z992 Dependence on renal dialysis: Secondary | ICD-10-CM | POA: Diagnosis not present

## 2017-03-20 DIAGNOSIS — N186 End stage renal disease: Principal | ICD-10-CM

## 2017-03-20 DIAGNOSIS — N185 Chronic kidney disease, stage 5: Secondary | ICD-10-CM | POA: Insufficient documentation

## 2017-03-20 DIAGNOSIS — D631 Anemia in chronic kidney disease: Secondary | ICD-10-CM

## 2017-03-20 LAB — CBC WITH DIFFERENTIAL (CANCER CENTER ONLY)
BASOS ABS: 0.1 10*3/uL (ref 0.0–0.1)
BASOS PCT: 1 %
EOS PCT: 3 %
Eosinophils Absolute: 0.3 10*3/uL (ref 0.0–0.5)
HCT: 25.3 % — ABNORMAL LOW (ref 34.8–46.6)
Hemoglobin: 7.9 g/dL — ABNORMAL LOW (ref 11.6–15.9)
Lymphocytes Relative: 24 %
Lymphs Abs: 2.4 10*3/uL (ref 0.9–3.3)
MCH: 30.7 pg (ref 25.1–34.0)
MCHC: 31.2 g/dL — AB (ref 31.5–36.0)
MCV: 98.4 fL (ref 79.5–101.0)
MONO ABS: 1 10*3/uL — AB (ref 0.1–0.9)
Monocytes Relative: 10 %
Neutro Abs: 6.3 10*3/uL (ref 1.5–6.5)
Neutrophils Relative %: 62 %
Platelet Count: 288 10*3/uL (ref 145–400)
RBC: 2.57 MIL/uL — ABNORMAL LOW (ref 3.70–5.45)
RDW: 22.4 % — AB (ref 11.2–14.5)
WBC Count: 10 10*3/uL (ref 3.9–10.3)
nRBC: 0 /100 WBC

## 2017-03-20 MED ORDER — DARBEPOETIN ALFA 300 MCG/0.6ML IJ SOSY
300.0000 ug | PREFILLED_SYRINGE | Freq: Once | INTRAMUSCULAR | Status: AC
  Administered 2017-03-20: 300 ug via SUBCUTANEOUS

## 2017-03-26 ENCOUNTER — Telehealth: Payer: Self-pay | Admitting: *Deleted

## 2017-03-26 NOTE — Telephone Encounter (Signed)
LMOM to return call.

## 2017-03-26 NOTE — Telephone Encounter (Signed)
Hospice nurse, Thomes Dinning, called to report that Mrs.Garay is wanting to have her femoral catheter removed even though she is a Hospice patient now. He is going to contact Dr. Serita Grit office regarding this removal and also the patient has some redness around her AVF site; Sam will also contact Dr. Gildardo Cranker regarding this Hospice patient's care.

## 2017-03-26 NOTE — Telephone Encounter (Signed)
Patient is taking 4-6 per day of Imodium. Also daughter wanted you to know that patient is getting her up every 1-2 hours during the night to urinate.

## 2017-03-26 NOTE — Telephone Encounter (Signed)
How much imodium is pt taking?

## 2017-03-26 NOTE — Telephone Encounter (Signed)
Hospice nurse, Thomes Dinning, called to report that Robin Hampton is wanting to have her femoral catheter removed even though she is a Hospice patient now. He is going to contact Dr. Serita Grit office regarding this removal and also the patient has some redness around her AVF site   Sam also stated that Dr. Eulas Post suggested patient to take OTC Imodium. Stated it helped alittle bit but yesterday patient had 6 loose stools and today she has had 1 so far.  Please Advise.

## 2017-03-27 NOTE — Telephone Encounter (Signed)
Doesn't hospice have a treatment guideline for diarrhea? I would recommend hospice handle the diarrhea concern. Also recommend hospice d/w family her urinary frequency. Please inform hospice of pt's issues. Going forward, family needs to contact hospice 1st and have hospice contact our office so that there will be no confusion.

## 2017-03-27 NOTE — Telephone Encounter (Signed)
Thomes Dinning, Hospice nurse notified and agreed.

## 2017-04-06 ENCOUNTER — Ambulatory Visit: Payer: Medicare Other

## 2017-04-06 ENCOUNTER — Telehealth: Payer: Self-pay | Admitting: *Deleted

## 2017-04-06 ENCOUNTER — Other Ambulatory Visit: Payer: Medicare Other

## 2017-04-06 NOTE — Telephone Encounter (Signed)
Melissa, daughter called and wants to know if you will order for patient to have Kidney Function checked. Stated that she is going to have a talk with her mother regarding the severity of her needing dialysis and she is thinking if she has numbers in front of her to show how bad it is it will help. Please Advise.

## 2017-04-06 NOTE — Telephone Encounter (Signed)
Pt has made up her mind that she no longer wants dialysis and her daughter needs to respect her decision. I do not recommend a lab to check her kidney function as we know that it will be elevated.

## 2017-04-06 NOTE — Telephone Encounter (Signed)
Daughter notified 

## 2017-04-19 ENCOUNTER — Encounter (HOSPITAL_COMMUNITY): Payer: Medicare Other

## 2017-04-19 ENCOUNTER — Encounter: Payer: Medicare Other | Admitting: Vascular Surgery

## 2017-04-27 ENCOUNTER — Inpatient Hospital Stay: Attending: Oncology

## 2017-04-27 ENCOUNTER — Inpatient Hospital Stay

## 2017-04-27 VITALS — BP 122/68 | HR 72 | Temp 98.2°F | Resp 16

## 2017-04-27 DIAGNOSIS — Z992 Dependence on renal dialysis: Principal | ICD-10-CM

## 2017-04-27 DIAGNOSIS — D631 Anemia in chronic kidney disease: Secondary | ICD-10-CM

## 2017-04-27 DIAGNOSIS — N184 Chronic kidney disease, stage 4 (severe): Secondary | ICD-10-CM | POA: Insufficient documentation

## 2017-04-27 DIAGNOSIS — N186 End stage renal disease: Secondary | ICD-10-CM

## 2017-04-27 LAB — CBC WITH DIFFERENTIAL (CANCER CENTER ONLY)
BASOS ABS: 0.1 10*3/uL (ref 0.0–0.1)
Basophils Relative: 1 %
Eosinophils Absolute: 0.4 10*3/uL (ref 0.0–0.5)
Eosinophils Relative: 4 %
HEMATOCRIT: 34.4 % — AB (ref 34.8–46.6)
Hemoglobin: 11 g/dL — ABNORMAL LOW (ref 11.6–15.9)
LYMPHS PCT: 19 %
Lymphs Abs: 1.9 10*3/uL (ref 0.9–3.3)
MCH: 31.1 pg (ref 25.1–34.0)
MCHC: 31.8 g/dL (ref 31.5–36.0)
MCV: 97.7 fL (ref 79.5–101.0)
MONO ABS: 0.7 10*3/uL (ref 0.1–0.9)
MONOS PCT: 7 %
NEUTROS ABS: 7.1 10*3/uL — AB (ref 1.5–6.5)
Neutrophils Relative %: 69 %
Platelet Count: 292 10*3/uL (ref 145–400)
RBC: 3.52 MIL/uL — ABNORMAL LOW (ref 3.70–5.45)
RDW: 16.8 % — ABNORMAL HIGH (ref 11.2–14.5)
WBC Count: 10.2 10*3/uL (ref 3.9–10.3)

## 2017-04-27 MED ORDER — DARBEPOETIN ALFA 300 MCG/0.6ML IJ SOSY
300.0000 ug | PREFILLED_SYRINGE | Freq: Once | INTRAMUSCULAR | Status: AC
  Administered 2017-04-27: 300 ug via SUBCUTANEOUS

## 2017-04-27 MED ORDER — DARBEPOETIN ALFA 300 MCG/0.6ML IJ SOSY
PREFILLED_SYRINGE | INTRAMUSCULAR | Status: AC
  Filled 2017-04-27: qty 0.6

## 2017-04-27 NOTE — Progress Notes (Signed)
Pt Hgb is 11.0 today spoke with Dr Alen Blew, provider still wants to give her aranesp injection today.

## 2017-04-27 NOTE — Patient Instructions (Signed)

## 2017-05-02 ENCOUNTER — Telehealth: Payer: Self-pay | Admitting: *Deleted

## 2017-05-02 NOTE — Telephone Encounter (Signed)
Sam with Hospice called and stated that patient was given Cipro 250mg  twice daily on Friday for 7 days for UTI. Tomorrow is her last day of the antibiotic and her urine is still cloudy and wonders if antibiotic should be extended alittle longer. Vitals are stable. Please Advise.

## 2017-05-03 ENCOUNTER — Other Ambulatory Visit: Payer: Self-pay | Admitting: Nurse Practitioner

## 2017-05-03 ENCOUNTER — Encounter: Payer: Self-pay | Admitting: Internal Medicine

## 2017-05-03 DIAGNOSIS — N185 Chronic kidney disease, stage 5: Principal | ICD-10-CM

## 2017-05-03 DIAGNOSIS — D631 Anemia in chronic kidney disease: Secondary | ICD-10-CM

## 2017-05-03 NOTE — Telephone Encounter (Signed)
Sam notified and agreed. Will collect urine.   Sam also wants to know if he can call in a Refill on patient's Imodium. Please Advise.

## 2017-05-03 NOTE — Telephone Encounter (Signed)
[place] for [employer]

## 2017-05-03 NOTE — Telephone Encounter (Signed)
Sam notified and will take care of it.

## 2017-05-03 NOTE — Telephone Encounter (Signed)
Do we have urine cx results? Recommend repeat UA to determine if UTI resolved

## 2017-05-07 ENCOUNTER — Other Ambulatory Visit: Payer: Self-pay | Admitting: *Deleted

## 2017-05-07 ENCOUNTER — Telehealth: Payer: Self-pay | Admitting: *Deleted

## 2017-05-07 MED ORDER — OXYCODONE HCL 5 MG PO TABS: 5.0000 mg | ORAL_TABLET | Freq: Three times a day (TID) | ORAL | 0 refills | Status: DC | PRN

## 2017-05-07 NOTE — Telephone Encounter (Signed)
Sam with Hospice called and stated that [facility] called in Nitrofurantoin 100mg  bid x 7 days for UTI that shows MRSA over the weekend. Man[place]ie wanted Sam to call our office to make sure this is what you want patient taking. Please Advise.   Received Urine Culture and placed in Dr. Vale Haven folder for review.  Urine Culture: Specimen Quality Adequate Result: 50,000-100,000 CFU/ml of Methicillin resistant Staphylococcus Aureus Appearance: Cloudy Glucose Trace Occult Blood 1+ Protein 2+ Leukocyte Esterase 3+ WBC 40-60 RBC 3-10 Bacteria Many  Ciprofloxacin R Gentamicin S Levofloxacin R Nitrofurantoin S Oxacillin R Tetracycline S Trimethoprim/Sulfa S Vancomycin S

## 2017-05-07 NOTE — Telephone Encounter (Signed)
Patient daughter called and requested refill on Oxycodone NCCSRS Database Verified LR 03/19/2017 Pharmacy confirmed Pended Rx and sent to Dr. Eulas Post for approval.

## 2017-05-07 NOTE — Telephone Encounter (Signed)
Sam notified and agreed.

## 2017-05-07 NOTE — Telephone Encounter (Signed)
Cont nitrofurantoin as infection sensitive to it

## 2017-05-09 ENCOUNTER — Telehealth: Payer: Self-pay | Admitting: *Deleted

## 2017-05-09 NOTE — Telephone Encounter (Signed)
Robin Hampton, daughter called and stated that patient has been taking Nitrofurantoin since Sunday for UTI. Stated that last night patient's confusion was worse than it has been in a long time. Daughter is wondering if the antibiotic needs to be changed or if she should continue it until gone. Daughter concerned with the confusion. Stated that patient is questioning everything daughter does. Please Advise.

## 2017-05-09 NOTE — Telephone Encounter (Signed)
The bacteria in urine is sensitive to abx. She is likely more confused due to worsening kidney failure. The daughter needs to call hospice and make them aware of her mental status change.

## 2017-05-09 NOTE — Telephone Encounter (Signed)
Patient daughter notified and agreed.  

## 2017-05-15 ENCOUNTER — Ambulatory Visit: Payer: Medicare Other

## 2017-05-15 ENCOUNTER — Ambulatory Visit: Payer: Medicare Other | Admitting: Oncology

## 2017-05-15 ENCOUNTER — Other Ambulatory Visit: Payer: Medicare Other

## 2017-05-16 ENCOUNTER — Telehealth: Payer: Self-pay | Admitting: *Deleted

## 2017-05-16 NOTE — Telephone Encounter (Signed)
Received fax from daughter Eual Fines [facility]) needing Dr. Eulas Post to review and complete form for unemployment Benefits to help cover extensive medical bills piling up since she was released from her job in [place] [employer], 2019. Stated she is no longer able to work and would require the form to be completed to be eligible for a weekly benefit.  Would like for faxed to Claims Operations Fax: [phone] Printed last OV and attached to form and placed in Dr. Vale Haven folder to review, fill out and sign.

## 2017-05-22 ENCOUNTER — Ambulatory Visit (HOSPITAL_COMMUNITY)
Admission: RE | Admit: 2017-05-22 | Discharge: 2017-05-22 | Disposition: A | Discharge status: Home / Self Care | Source: Ambulatory Visit | Attending: Vascular Surgery | Admitting: Vascular Surgery

## 2017-05-22 ENCOUNTER — Telehealth: Payer: Self-pay | Admitting: *Deleted

## 2017-05-22 DIAGNOSIS — N185 Chronic kidney disease, stage 5: Secondary | ICD-10-CM | POA: Insufficient documentation

## 2017-05-22 DIAGNOSIS — Z48812 Encounter for surgical aftercare following surgery on the circulatory system: Secondary | ICD-10-CM | POA: Insufficient documentation

## 2017-05-22 NOTE — Telephone Encounter (Signed)
Received form back from Dr. Eulas Post.  Faxed to [place] Dept [employer] Fax: [phone]. Sent for scanning.

## 2017-05-22 NOTE — Telephone Encounter (Signed)
Received fax from [facility]) asking Dr Eulas Post to [place] in order for patient to receive Unemployment compensation benefits to cover medical expenses. To be faxed to: [phone]-[place] [employer]   Placed in Dr. Vale Haven folder to review and sign. Attached last OV note.

## 2017-05-24 ENCOUNTER — Ambulatory Visit (INDEPENDENT_AMBULATORY_CARE_PROVIDER_SITE_OTHER): Payer: Self-pay | Admitting: Vascular Surgery

## 2017-05-24 VITALS — BP 181/85 | HR 75 | Temp 97.6°F | Ht 63.0 in | Wt 105.0 lb

## 2017-05-24 DIAGNOSIS — N184 Chronic kidney disease, stage 4 (severe): Secondary | ICD-10-CM

## 2017-05-24 NOTE — Progress Notes (Signed)
Patient is a 72 year old female who returns today for postoperative follow-up after right basilic vein transposition fistula.  She denies any numbness or tingling in her hand.  She currently is not using the fistula for dialysis.  She is considering not starting dialysis.  Physical exam:  Vitals:   05/24/17 1133  BP: (!) 181/85  Pulse: 75  Temp: 97.6 F (36.4 C)  TempSrc: Oral  SpO2: 98%  Weight: 105 lb (47.6 kg)  Height: 5\' 3"  (1.6 m)    Extremities: Palpable thrill right upper arm AV fistula all incisions well-healed  Data: Patient had a duplex ultrasound that shows her fistula is 2 mm from the skin surface 6-8 mm diameter.  Assessment: Maturing AV fistula right arm.  Plan: If the patient decides to pursue dialysis it should be ready for cannulation in May 2019.  She will follow-up on an as-needed basis.  Ruta Hinds, MD [facility] Office: [phone] Pager: [phone]

## 2017-06-01 ENCOUNTER — Telehealth: Payer: Self-pay

## 2017-06-01 NOTE — Telephone Encounter (Signed)
Robin Hampton with Hospice called to request an order for prophylactic macrobid 50 mg BID. Pt recently 05-05-17 had UA positive for MRSA and was treated with macrobid 50 mg BID. Pt currently had urinary frequency and dysuria.   Per verbal conversation with Dr. Eulas Post, pt to take macrobid 50 mg BID as prophylactic.  Sam stated that he would order the medication today.

## 2017-06-05 ENCOUNTER — Ambulatory Visit (INDEPENDENT_AMBULATORY_CARE_PROVIDER_SITE_OTHER): Admitting: Internal Medicine

## 2017-06-05 ENCOUNTER — Telehealth: Payer: Self-pay | Admitting: *Deleted

## 2017-06-05 ENCOUNTER — Encounter: Payer: Self-pay | Admitting: Internal Medicine

## 2017-06-05 VITALS — BP 140/82 | HR 58 | Temp 97.8°F | Ht 63.0 in | Wt 112.0 lb

## 2017-06-05 DIAGNOSIS — I480 Paroxysmal atrial fibrillation: Secondary | ICD-10-CM | POA: Diagnosis not present

## 2017-06-05 DIAGNOSIS — R627 Adult failure to thrive: Secondary | ICD-10-CM

## 2017-06-05 DIAGNOSIS — N186 End stage renal disease: Secondary | ICD-10-CM | POA: Diagnosis not present

## 2017-06-05 DIAGNOSIS — E034 Atrophy of thyroid (acquired): Secondary | ICD-10-CM | POA: Diagnosis not present

## 2017-06-05 DIAGNOSIS — D631 Anemia in chronic kidney disease: Secondary | ICD-10-CM | POA: Diagnosis not present

## 2017-06-05 DIAGNOSIS — I2699 Other pulmonary embolism without acute cor pulmonale: Secondary | ICD-10-CM

## 2017-06-05 DIAGNOSIS — N185 Chronic kidney disease, stage 5: Secondary | ICD-10-CM

## 2017-06-05 MED ORDER — "SYRINGE/NEEDLE (DISP) 25G X 1"" 3 ML MISC": 6 refills | Status: AC

## 2017-06-05 MED ORDER — APIXABAN 2.5 MG PO TABS: 2.5000 mg | ORAL_TABLET | Freq: Two times a day (BID) | ORAL | 6 refills | Status: DC

## 2017-06-05 NOTE — Patient Instructions (Addendum)
Continue current medications as ordered  Hospice to follow  No need to follow up with hematology   Follow up in 3 mos for ESRD, FTT
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
## 2017-06-05 NOTE — Telephone Encounter (Signed)
Thomes Dinning with Hospice called and requested verbal orders to recert. Hospice Care. Verbal orders given.

## 2017-06-07 ENCOUNTER — Other Ambulatory Visit: Payer: Self-pay | Admitting: Nurse Practitioner

## 2017-06-07 ENCOUNTER — Other Ambulatory Visit: Payer: Self-pay | Admitting: Internal Medicine

## 2017-06-07 DIAGNOSIS — N185 Chronic kidney disease, stage 5: Secondary | ICD-10-CM

## 2017-06-13 ENCOUNTER — Telehealth: Payer: Self-pay | Admitting: *Deleted

## 2017-06-13 NOTE — Telephone Encounter (Signed)
Received form from [place] for Prior Authorization for Mirtazapine 15mg . Placed in Dr. [place] folder to review, fill out and sign. To be faxed back to [place] ID#: [id number] ([place]) [place]: [phone]

## 2017-06-20 ENCOUNTER — Telehealth: Payer: Self-pay | Admitting: *Deleted

## 2017-06-20 NOTE — Telephone Encounter (Signed)
Robin Hampton notified and stated that he would notify daughter to schedule appointment.

## 2017-06-20 NOTE — Telephone Encounter (Signed)
Pt needs to be seen to determine tx plan

## 2017-06-20 NOTE — Telephone Encounter (Signed)
Sam with Hospice called and stated that he just wanted to let you know that patient is still having some burning and pain with urination. Has been on antibiotic since 4/26. Stated that symptoms have decreased but still there. Having some upper abdomen pain, Normal active bowel sound with BM yesterday.  Also complaining of alittle nausea and fatigue but patient told Sam that her family has also been sick with sinus infections.  Vitals: SpO2 95%, RR: 18, Pulse 62, BP 128/68 T: 97.5 Please Advise.

## 2017-06-21 ENCOUNTER — Other Ambulatory Visit: Payer: Self-pay | Admitting: Internal Medicine

## 2017-06-21 ENCOUNTER — Encounter: Payer: Self-pay | Admitting: Nurse Practitioner

## 2017-06-21 ENCOUNTER — Ambulatory Visit: Payer: Medicare Other | Admitting: Nurse Practitioner

## 2017-06-21 ENCOUNTER — Ambulatory Visit (INDEPENDENT_AMBULATORY_CARE_PROVIDER_SITE_OTHER): Payer: Medicare Other | Admitting: Nurse Practitioner

## 2017-06-21 VITALS — BP 156/86 | HR 82 | Temp 97.8°F | Ht 63.0 in | Wt 109.0 lb

## 2017-06-21 DIAGNOSIS — R300 Dysuria: Secondary | ICD-10-CM | POA: Diagnosis not present

## 2017-06-21 LAB — POCT URINALYSIS DIPSTICK
Bilirubin, UA: NEGATIVE
Glucose, UA: NEGATIVE
KETONES UA: NEGATIVE
NITRITE UA: NEGATIVE
Odor: NORMAL
PH UA: 6 (ref 5.0–8.0)
PROTEIN UA: 2000
Spec Grav, UA: 1.015 (ref 1.010–1.025)
Urobilinogen, UA: 0.2 E.U./dL

## 2017-06-21 NOTE — Patient Instructions (Signed)
To continue to push fluids  Will send off for urine culture

## 2017-06-21 NOTE — Progress Notes (Signed)
Careteam: Patient Care Team: Gildardo Cranker, DO as PCP - General (Internal Medicine)  Advanced Directive information    Allergies  Allergen Reactions  . Phenergan [Promethazine] Anaphylaxis and Other (See Comments)    Pulmonary arrest   . Trazodone And Nefazodone Other (See Comments)    Serotonin syndrome  . Codeine Nausea Only    Chief Complaint  Patient presents with  . Acute Visit    Dysuria for several days. Urinary tract infection symptoms, this has been an ongoing issue. Sample recieved. She did take the macrodantin this morning,  . Medication Refill    Oxycodone, famotidine, macrodatin, prochlorpraz and levothyroxine needed     HPI: Patient is a 72 y.o. female seen in the office today for "another UTI" reports she started having symptoms 7 weeks. Having increase in pain with urination and urgency with frequency.  Pt with hx of frequent UTI. Used to have terrible ones but since she has been with daughter they have been better Recently with increase in diarrhea and maybe contributing.  Dr Eulas Post placed her nitrofurantion 50 mg BID for prophylactic 4 weeks ago but with increase in symptoms.   They were going to run an errand 3 weeks ago and had to have multiple stops to let her urinate.  Previously seeing urologist due to severe UTI.  Review of Systems:  Review of Systems  Constitutional: Positive for malaise/fatigue. Negative for chills and fever.       Reports she "feels bad all the time"  Genitourinary: Positive for dysuria, frequency and urgency. Negative for flank pain.    Past Medical History:  Diagnosis Date  . Anal sphincter incompetence   . Anemia   . Aortic atherosclerosis ([place])   . Chronic UTI (urinary tract infection)   . Clostridium difficile diarrhea   . Diastolic CHF ([place])   . Dyspnea    reports having this daily  . E-coli UTI   . Encephalopathy   . Endometrial ca ([place])   . Hearing loss   . Hematuria   . High risk medication use   .  History of gastric bypass   . Hx of gastric bypass   . Hypertension   . Hypocalcemia   . Hypothyroidism   . Insomnia   . Kidney stones   . Midline cystocele   . Nonrheumatic tricuspid valve disorder   . Opioid dependence ([place])    continuous  . Orthostatic hypotension   . Osteoarthritis of back   . Osteoarthritis of knees, bilateral   . Paroxysmal supraventricular tachycardia ([place])   . Rectocele   . Renal failure, chronic, stage 4 (severe) ([facility])   . Renovascular hypertension   . Repeated falls   . 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 Sensory urge incontinence   . Serotonin syndrome   . Severe depression ([place])   . Short-term memory loss   . Sleep apnea   . Stroke ([place])   . Tachycardia   . Urinary retention    requiring self-catheterization  . Vaginal vault prolapse    Past Surgical History:  Procedure Laterality Date  . ANAL SPHINCTEROPLASTY    . ANTERIOR AND POSTERIOR VAGINAL REPAIR    . ANTERIOR AND POSTERIOR VAGINAL REPAIR W/ SACROSPINOUS LIGAMENT SUSPENSION    . anterior coloporrhaphy     with uphold light  . BASCILIC VEIN TRANSPOSITION Right 03/07/2017   Procedure: BASILIC VEIN TRANSPOSITION;  Surgeon: Elam Dutch, MD;  Location: [facility];  Service: Vascular;  Laterality: Right;  . CHOLECYSTECTOMY    . COLONOSCOPY  08/2015  . CYSTOSCOPY    . DG  BONE DENSITY ([place] [facility])  2013  . DIAGNOSTIC MAMMOGRAM  2013  . EXPLORATORY LAPAROTOMY    . GASTRIC BYPASS  1997   Dr. Greig Castilla  . HIP ARTHROPLASTY Left   . INCONTINENCE SURGERY     Jefferey McQueary  . INSERTION OF DIALYSIS CATHETER N/A 03/07/2017   Procedure: INSERTION OF DIALYSIS CATHETER, FEMORAL ARTERY;  Surgeon: Elam Dutch, MD;  Location: [facility];  Service: Vascular;  Laterality: N/A;  . LAPAROSCOPIC HYSTERECTOMY    . ORIF ELBOW FRACTURE Left   . PANNICULECTOMY    . RECTOCELE REPAIR     Jefferey McQueary  . US KIDNEYS ([place] [facility]X)  12/2016   Dr. Eliberto Ivory Kidney ordered   . VENOGRAM Bilateral  03/07/2017   Procedure: CENTRAL VENOGRAM BILATERAL ;  Surgeon: Elam Dutch, MD;  Location: [facility] OR;  Service: Vascular;  Laterality: Bilateral;   Social History:   reports that she has quit smoking. Her smoking use included cigarettes. She quit after 25.00 years of use. She has never used smokeless tobacco. She reports that she drinks about 0.6 - 1.2 oz of alcohol per week. She reports that she does not use drugs.  Family History  Problem Relation Age of Onset  . Hypertension Mother   . Heart attack Mother   . Colon cancer Mother   . Hypothyroidism Mother   . Renal cancer Father   . CVA Father   . Hypertension Father   . CVA Brother   . Bipolar disorder Brother   . Depression Brother   . Hypertension Brother   . Osteoarthritis Brother   . Asthma Daughter   . Allergies Daughter     Medications: Patient's Medications  New Prescriptions   No medications on file  Previous Medications   ACETAMINOPHEN (TYLENOL) 500 MG TABLET    Take 500 mg by mouth 3 (three) times daily.    AMLODIPINE (NORVASC) 5 MG TABLET    Take 5 mg by mouth as needed [facility] nurse will indicate when patient's needs, patient with low b/p readings).    APIXABAN (ELIQUIS) 2.5 MG TABS TABLET    Take 1 tablet (2.5 mg total) by mouth 2 (two) times daily.   B COMPLEX-VITAMIN C-FOLIC ACID (NEPHRO-VITE) 0.8 MG TABS TABLET    Take 1 tablet by mouth daily.   CETIRIZINE (ZYRTEC) 10 MG TABLET    Take 10 mg by mouth daily.   [facility] B-1 100 MG TABLET    TAKE 1 TABLET BY MOUTH EVERY DAY   CYANOCOBALAMIN (,VITAMIN B-12,) 1000 MCG/ML INJECTION    Inject 1 mL (1,000 mcg total) into the muscle every 30 (thirty) days.   DIPHENHYDRAMINE (BENADRYL) 25 MG TABLET    Take 25 mg by mouth daily as needed for itching, allergies or sleep.   FAMOTIDINE (PEPCID) 20 MG TABLET    Take 1 tablet (20 mg total) by mouth daily as needed for heartburn or indigestion.   FLUTICASONE (FLONASE) 50 MCG/ACT NASAL SPRAY    Place 1 spray into both nostrils  daily as needed for allergies or rhinitis.   FUROSEMIDE (LASIX) 20 MG TABLET    Take 10 mg by mouth daily as needed for fluid or edema.   LEVOTHYROXINE (SYNTHROID, LEVOTHROID) 175 MCG TABLET    Take 1 tablet (175 mcg total) by mouth daily before breakfast.   LOPERAMIDE (IMODIUM) 2 MG CAPSULE  Take 1 by mouth twice daily   MIRTAZAPINE (REMERON SOL-TAB) 30 MG DISINTEGRATING TABLET    TAKE 1 TABLET (30 MG TOTAL) BY MOUTH AT BEDTIME.   NITROFURANTOIN (MACRODANTIN) 50 MG CAPSULE    Take 50 mg by mouth 2 (two) times daily.   OXYCODONE (OXY IR/ROXICODONE) 5 MG IMMEDIATE RELEASE TABLET    Take 1 tablet (5 mg total) by mouth every 8 (eight) hours as needed.   PROCHLORPERAZINE (COMPAZINE) 10 MG TABLET    TAKE 1 TABLET BY MOUTH EVERY 6 HOURS AS NEEDED FOR NAUSEA AND VOMITING   SODIUM BICARBONATE 650 MG TABLET    TAKE 1 TABLET (650 MG TOTAL) BY MOUTH 3 (THREE) TIMES DAILY.   SYRINGE-NEEDLE, DISP, 3 ML 25G X 1" 3 ML MISC    Use once daily with the administration of 1000 mcg of B12  Modified Medications   No medications on file  Discontinued Medications   No medications on file     Physical Exam:  Vitals:   06/21/17 1554  BP: (!) 156/86  Pulse: 82  Temp: 97.8 F (36.6 C)  SpO2: 92%  Weight: 109 lb (49.4 kg)  Height: 5\' 3"  (1.6 m)   Body mass index is 19.31 kg/m.  Physical Exam  Constitutional: She is oriented to person, place, and time.  Frail appearing in NAD  Neck: Carotid bruit is not present.  Cardiovascular: Normal rate. An irregularly irregular rhythm present.  Murmur (1/6 SEM) heard. Pulmonary/Chest: Effort normal and breath sounds normal.  Abdominal: Soft. Bowel sounds are normal. She exhibits no distension and no mass. There is no hepatomegaly. There is no tenderness. There is no rebound and no guarding.  Musculoskeletal: She exhibits deformity (thoracic kyphosis).  Neurological: She is alert and oriented to person, place, and time. She has normal reflexes.  Skin: Skin is warm  and dry.  Bronze appearing  Psychiatric: She has a normal mood and affect. Her behavior is normal. Thought content normal.    Labs reviewed: Basic Metabolic Panel: Recent Labs    10/14/16 01/12/17 1245 02/01/17 1523 03/02/17 1120 03/07/17 0815  NA 140 137 136  --  141  K 3.0* 5.3 5.3  --  5.4*  CL  --  99 98  --   --   CO2  --  27 30  --   --   GLUCOSE  --  85 95  --  58*  BUN 52* 86* 71*  --   --   CREATININE 5.7* 5.53* 5.22*  --   --   CALCIUM  --  9.9 11.1*  --   --   TSH  --  0.03*  --  0.09*  --    Liver Function Tests: Recent Labs    08/10/16 10/14/16 01/12/17 1245 02/01/17 1523  AST 32 214* 20 14  ALT 24 95* 12 6  ALKPHOS 141* 244*  --   --   BILITOT  --   --  0.6 0.4  PROT  --   --  6.3 5.8*   No results for input(s): LIPASE, AMYLASE in the last 8760 hours. No results for input(s): AMMONIA in the last 8760 hours. CBC: Recent Labs    02/01/17 1523 03/07/17 0815 03/20/17 1521 04/27/17 1414  WBC 11.8*  --  10.0 10.2  NEUTROABS 7,576  --  6.3 7.1*  HGB 10.3* 9.5* 7.9* 11.0*  HCT 32.0* 28.0* 25.3* 34.4*  MCV 88.4  --  98.4 97.7  PLT 441*  --  288  292   Lipid Panel: Recent Labs    01/12/17 1245  CHOL 94  HDL 40*  LDLCALC 36  TRIG 93  CHOLHDL 2.4   TSH: Recent Labs    01/12/17 1245 03/02/17 1120  TSH 0.03* 0.09*   A1C: No results found for: HGBA1C   Assessment/Plan 1. Dysuria - POC Urinalysis Dipstick- Abnormal with leukocytes, blood and protein will send for Culture, Urine -will await culture before prescribing treatment, patient and daughter agreeable to this. -encouraged to increase fluid intake.   Next appt: 09/05/2017 Carlos American. [place] [phone]

## 2017-06-22 LAB — URINE CULTURE
MICRO NUMBER: 90600311
Result:: NO GROWTH
SPECIMEN QUALITY:: ADEQUATE

## 2017-06-27 ENCOUNTER — Other Ambulatory Visit: Payer: Self-pay | Admitting: *Deleted

## 2017-06-27 MED ORDER — OXYCODONE HCL 5 MG PO TABS: 5.0000 mg | ORAL_TABLET | Freq: Three times a day (TID) | ORAL | 0 refills | Status: DC | PRN

## 2017-06-27 NOTE — Telephone Encounter (Signed)
Patient daughter, Lenna Sciara Requested [place] Verified LR: 05/07/2017 Pharmacy Confirmed Pended Rx and sent to Dr. Eulas Post for approval.

## 2017-07-01 ENCOUNTER — Other Ambulatory Visit: Payer: Self-pay | Admitting: Internal Medicine

## 2017-07-06 ENCOUNTER — Other Ambulatory Visit: Payer: Self-pay | Admitting: Internal Medicine

## 2017-07-06 NOTE — Telephone Encounter (Signed)
Not on current med list. No notes or labs to show necessity. Please advise. Thanks

## 2017-07-08 ENCOUNTER — Other Ambulatory Visit: Payer: Self-pay | Admitting: Nurse Practitioner

## 2017-07-08 DIAGNOSIS — N185 Chronic kidney disease, stage 5: Principal | ICD-10-CM

## 2017-07-08 DIAGNOSIS — D631 Anemia in chronic kidney disease: Secondary | ICD-10-CM

## 2017-07-09 ENCOUNTER — Other Ambulatory Visit: Payer: Self-pay | Admitting: Internal Medicine

## 2017-07-09 NOTE — Telephone Encounter (Signed)
Called patient to clarify prescription, it was stopped by patient in January. Spoke with patient's daughter Lenna Sciara who states the patient's levels have dropped according to Dr. Trula Slade and has been advised to start the ferrous gluconate again. Routing to Dr. Eulas Post to verify.

## 2017-07-10 ENCOUNTER — Telehealth: Payer: Self-pay | Admitting: *Deleted

## 2017-07-10 DIAGNOSIS — D631 Anemia in chronic kidney disease: Secondary | ICD-10-CM

## 2017-07-10 DIAGNOSIS — N185 Chronic kidney disease, stage 5: Principal | ICD-10-CM

## 2017-07-10 DIAGNOSIS — I480 Paroxysmal atrial fibrillation: Secondary | ICD-10-CM

## 2017-07-10 MED ORDER — APIXABAN 2.5 MG PO TABS: 2.5000 mg | ORAL_TABLET | Freq: Two times a day (BID) | ORAL | 6 refills | Status: AC

## 2017-07-10 MED ORDER — FERROUS GLUCONATE 324 (38 FE) MG PO TABS: 324.0000 mg | ORAL_TABLET | Freq: Every day | ORAL | 1 refills | Status: AC

## 2017-07-10 MED ORDER — THIAMINE HCL 100 MG PO TABS: 100.0000 mg | ORAL_TABLET | Freq: Every day | ORAL | 1 refills | Status: AC

## 2017-07-10 MED ORDER — LOPERAMIDE HCL 2 MG PO TABS: ORAL_TABLET | ORAL | 1 refills | Status: AC

## 2017-07-10 NOTE — Telephone Encounter (Signed)
Thomes Dinning with Hospice called and stated that daughter called him and stated that patient is having increased Anxiety and Nervousness. Daughter started giving her some of her Xanax and it seems to work. Wants to know if a Rx can be given to patient. Please advise.    Also requesting refills to be sent to pharmacy. Faxed.

## 2017-07-10 NOTE — Telephone Encounter (Signed)
Sam with Hospice called to add information to message, patient c/o abdominal pain 5/10, burning when urinating, and would electrolytes checked  Please advise

## 2017-07-11 MED ORDER — LORAZEPAM 0.5 MG PO TABS: 0.2500 mg | ORAL_TABLET | Freq: Two times a day (BID) | ORAL | 0 refills | Status: AC | PRN

## 2017-07-11 NOTE — Telephone Encounter (Signed)
What does hospice typically prescribe for anxiety?

## 2017-07-11 NOTE — Telephone Encounter (Signed)
I spoke with Robin Hampton and he has verbalized understanding. A prescription for lorazepam 0.5 mg was called in to the pharmacy and medication list has been updated.   I left a message for patient's daughter to call the office regarding medication change.

## 2017-07-11 NOTE — Telephone Encounter (Signed)
Please advise on request for Xanax for anxiety and nervousness

## 2017-07-11 NOTE — Telephone Encounter (Signed)
How is checking her electrolytes going to help her dysuria? Renal function is likely worsening and this is the cause of her abdominal pain and dysuria. Urine culture neg when checked last month for same problem

## 2017-07-11 NOTE — Telephone Encounter (Signed)
I would prefer lorazepam as it is longer acting. May have lorazepam 0.5mg  #30 take 1/2 tab po BID prn anxiety/agitation, no RF

## 2017-07-11 NOTE — Telephone Encounter (Signed)
Spoke with Robin Hampton, Robin Hampton verbalized understanding of Dr.Carter's response to both incoming messages. Patient states abdominal pain is better and was possibly the results of a meal.   Hospice will typically use ativan or xanax for anxiety/nervousness

## 2017-07-19 ENCOUNTER — Telehealth: Payer: Self-pay

## 2017-07-19 NOTE — Telephone Encounter (Signed)
PA initiated through [facility]  Message copied and pasted from CoverMyMeds: Your PA has been faxed to the plan as a paper copy. Please contact the plan directly if you haven't received a determination in a typical timeframe.You will be notified of the determination via fax

## 2017-07-23 ENCOUNTER — Other Ambulatory Visit: Payer: Self-pay | Admitting: Internal Medicine

## 2017-07-23 DIAGNOSIS — K219 Gastro-esophageal reflux disease without esophagitis: Secondary | ICD-10-CM

## 2017-07-25 ENCOUNTER — Other Ambulatory Visit: Payer: Self-pay | Admitting: *Deleted

## 2017-07-25 MED ORDER — MIRTAZAPINE 30 MG PO TBDP: 30.0000 mg | ORAL_TABLET | Freq: Every day | ORAL | 2 refills | Status: AC

## 2017-07-25 NOTE — Telephone Encounter (Signed)
Daughter requested refill with Dx for insurance to cover.

## 2017-07-29 ENCOUNTER — Other Ambulatory Visit: Payer: Self-pay | Admitting: Internal Medicine

## 2017-07-30 ENCOUNTER — Other Ambulatory Visit: Payer: Self-pay | Admitting: *Deleted

## 2017-07-30 NOTE — Telephone Encounter (Signed)
Patient daughter called requesting refill [place] Verified LR: 06/27/2017 Pharmacy Confirmed Pended Rx and sent to Dr. Eulas Post for approval.

## 2017-07-31 ENCOUNTER — Other Ambulatory Visit: Payer: Self-pay | Admitting: Internal Medicine

## 2017-07-31 DIAGNOSIS — N185 Chronic kidney disease, stage 5: Principal | ICD-10-CM

## 2017-07-31 DIAGNOSIS — D631 Anemia in chronic kidney disease: Secondary | ICD-10-CM

## 2017-07-31 MED ORDER — OXYCODONE HCL 5 MG PO TABS: 5.0000 mg | ORAL_TABLET | Freq: Three times a day (TID) | ORAL | 0 refills | Status: AC | PRN

## 2017-07-31 NOTE — Telephone Encounter (Signed)
Patient daughter called today and stated that patient is almost out of medication and her pharmacy hasn't received Rx yet.   Rx resent to Dr. Eulas Post for approval.

## 2017-07-31 NOTE — Telephone Encounter (Signed)
Another incoming fax was received from [facility] to initiate a prior authorization, PA was initiated on 07/19/17. I checked the CoverMyMeds website and no response was provided by insurance company [facility]). I called [phone]) and was informed that Hospice will not cover medication and we can try to run PA under a different insurance or check with the Hospice Nurse to inquire about medication change.    I called Thomes Dinning, patient's Hospice nurse to inquire about medication PA request, awaiting return call

## 2017-08-10 NOTE — Telephone Encounter (Signed)
Robin Hampton with hospice calling asking for Xanax per the daughter, pt was prescribed Thorazine 25 mg by the hospice NP and daughter states that is not working and want Xanax prescribed. Please advise  (Thorazine is not in her med list)

## 2017-08-10 NOTE — Telephone Encounter (Signed)
Spoke with Thomes Dinning hospice nurse, pt became fearful, paranoid and agitated on the Lorazepam. Please advise

## 2017-08-10 NOTE — Telephone Encounter (Signed)
She has been given lorazepam in the past. Did that help her anxiety at all?

## 2017-08-11 NOTE — Telephone Encounter (Signed)
[place] for alprazolam 0.5mg  #30 take 0.5-1 tab po TID prn anxiety/agitation

## 2017-08-13 MED ORDER — ALPRAZOLAM 0.5 MG PO TABS: 0.2500 mg | ORAL_TABLET | Freq: Three times a day (TID) | ORAL | 0 refills | Status: AC | PRN

## 2017-08-13 NOTE — Telephone Encounter (Signed)
Left message on machine asking Sam to return my call.

## 2017-08-13 NOTE — Addendum Note (Signed)
Addended by: Despina Hidden on: 08/13/2017 10:13 AM   Modules accepted: Orders

## 2017-08-16 ENCOUNTER — Telehealth: Payer: Self-pay | Admitting: *Deleted

## 2017-08-16 NOTE — Telephone Encounter (Signed)
Robin Hampton with Hospice called and stated that he needed verbal order to recert patient for Hospice. Verbal orders given.

## 2017-08-19 ENCOUNTER — Other Ambulatory Visit: Payer: Self-pay | Admitting: Internal Medicine

## 2017-09-05 ENCOUNTER — Ambulatory Visit: Payer: Medicare Other | Admitting: Internal Medicine

## 2017-09-06 DEATH — deceased

## 2017-09-22 ENCOUNTER — Other Ambulatory Visit: Payer: Self-pay | Admitting: Internal Medicine

## 2019-05-30 IMAGING — CR DG CHEST 1V PORT
1 series · 1 of 1 positions shown · non-contrast
Comparison: Chest CT 03/06/2017.

CLINICAL DATA: 71-year-old female status post dialysis catheter
placement.

EXAM:
PORTABLE CHEST 1 VIEW

[AP]
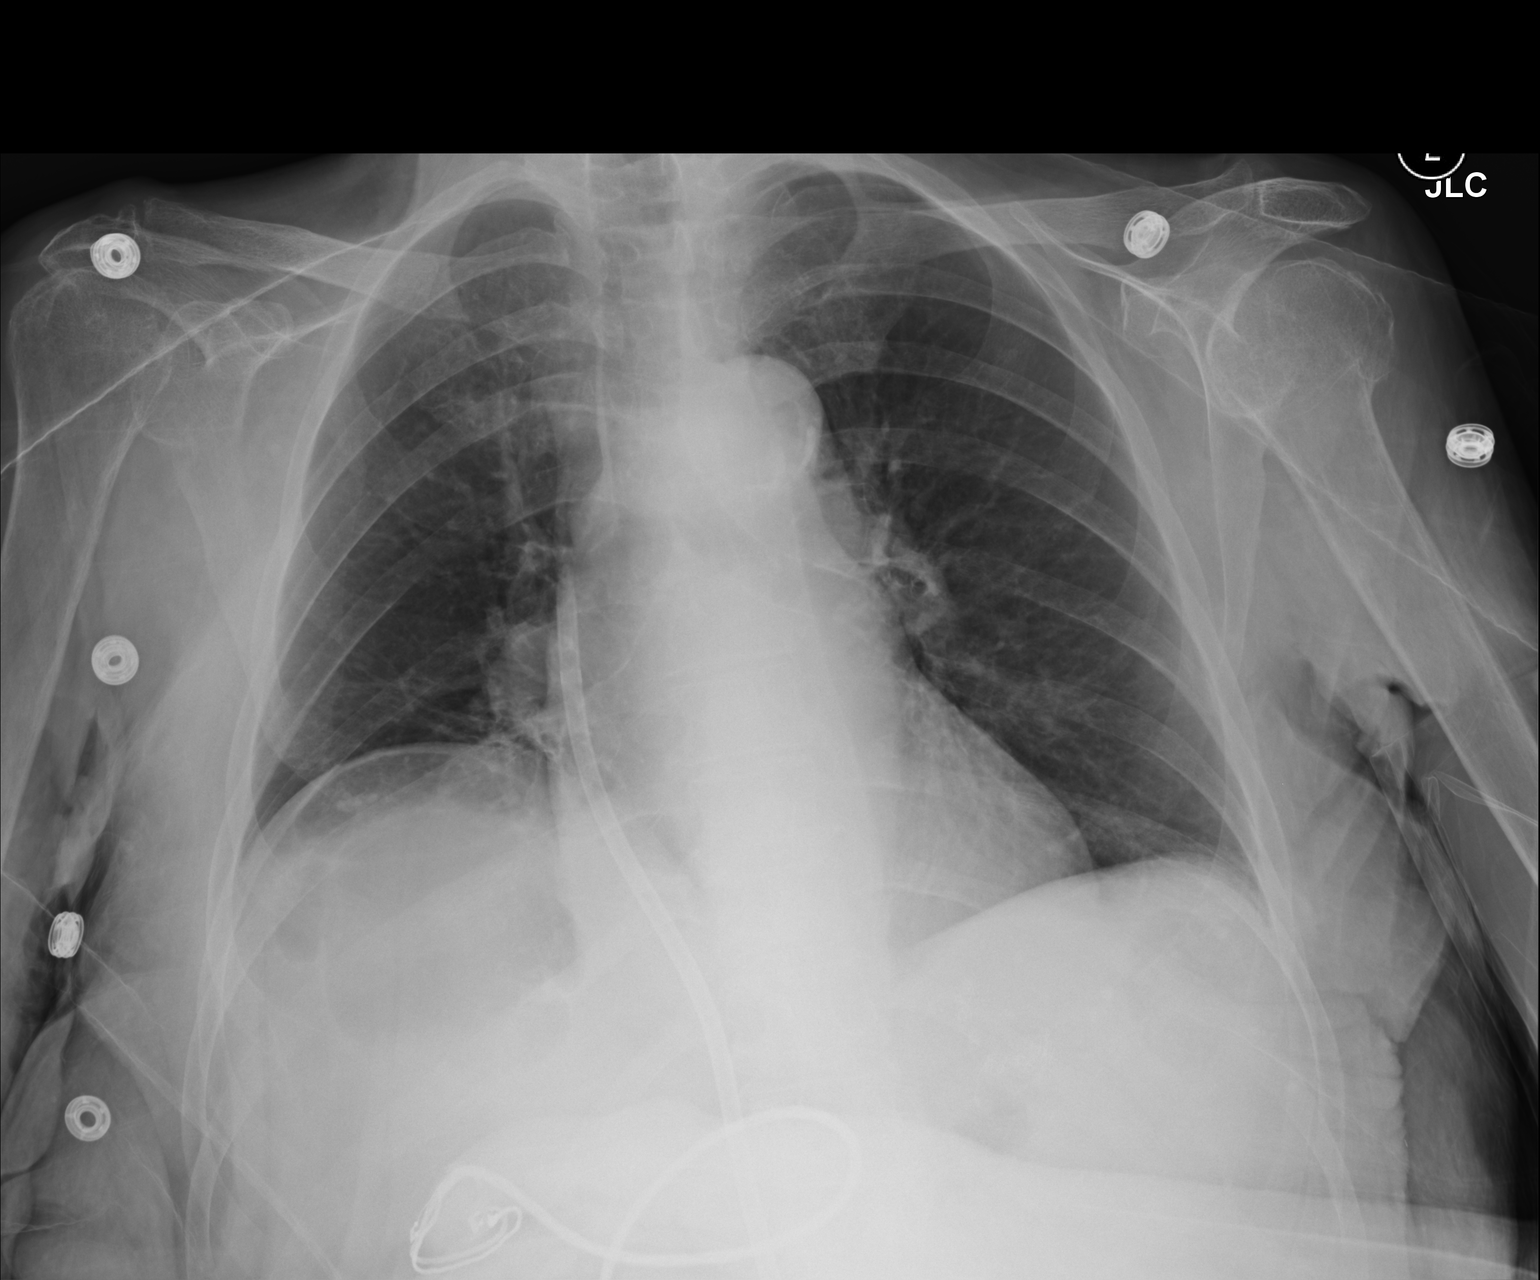

[1 of 1 positions shown; findings below may reference images not displayed]

FINDINGS: Portable AP upright view at 0226 hrs. Inferior approach large
caliber dialysis type catheter is in place, and the tunneled type
right IJ approach dialysis catheter has been removed. The new
catheter tip projects at the lower SVC level, the level of the
carina. Normal cardiac size and mediastinal contours. Continued
somewhat low lung volumes. No pneumothorax, pulmonary edema, pleural
effusion or confluent pulmonary opacity.
IMPRESSION: 1. Right IJ approach dialysis catheter removed and IVC approach
catheter placed. New catheter tip is at the level of the lower
SVC/carina.
2.  No acute cardiopulmonary abnormality.
